# Patient Record
Sex: Female | Born: 1965 | Race: White | Hispanic: No | Marital: Married | Smoking: Former smoker
Health system: Southern US, Community
[De-identification: ages and names within clinical notes are randomized; demographics above are authoritative.]

## PROBLEM LIST (undated history)

## (undated) DIAGNOSIS — Z79899 Other long term (current) drug therapy: Secondary | ICD-10-CM

## (undated) DIAGNOSIS — K219 Gastro-esophageal reflux disease without esophagitis: Secondary | ICD-10-CM

## (undated) DIAGNOSIS — I119 Hypertensive heart disease without heart failure: Secondary | ICD-10-CM

## (undated) DIAGNOSIS — G8929 Other chronic pain: Secondary | ICD-10-CM

## (undated) DIAGNOSIS — R079 Chest pain, unspecified: Secondary | ICD-10-CM

## (undated) DIAGNOSIS — J309 Allergic rhinitis, unspecified: Secondary | ICD-10-CM

## (undated) DIAGNOSIS — I480 Paroxysmal atrial fibrillation: Secondary | ICD-10-CM

## (undated) DIAGNOSIS — IMO0001 Reserved for inherently not codable concepts without codable children: Secondary | ICD-10-CM

## (undated) DIAGNOSIS — I519 Heart disease, unspecified: Secondary | ICD-10-CM

## (undated) DIAGNOSIS — L600 Ingrowing nail: Secondary | ICD-10-CM

## (undated) DIAGNOSIS — F329 Major depressive disorder, single episode, unspecified: Secondary | ICD-10-CM

## (undated) DIAGNOSIS — M5442 Lumbago with sciatica, left side: Secondary | ICD-10-CM

## (undated) DIAGNOSIS — F419 Anxiety disorder, unspecified: Secondary | ICD-10-CM

## (undated) DIAGNOSIS — M5430 Sciatica, unspecified side: Secondary | ICD-10-CM

## (undated) DIAGNOSIS — Z975 Presence of (intrauterine) contraceptive device: Secondary | ICD-10-CM

## (undated) DIAGNOSIS — I10 Essential (primary) hypertension: Secondary | ICD-10-CM

## (undated) DIAGNOSIS — E669 Obesity, unspecified: Secondary | ICD-10-CM

## (undated) DIAGNOSIS — S83206A Unspecified tear of unspecified meniscus, current injury, right knee, initial encounter: Secondary | ICD-10-CM

## (undated) DIAGNOSIS — F32A Depression, unspecified: Secondary | ICD-10-CM

## (undated) DIAGNOSIS — M545 Low back pain: Secondary | ICD-10-CM

## (undated) DIAGNOSIS — R7303 Prediabetes: Secondary | ICD-10-CM

## (undated) HISTORY — DX: Gastro-esophageal reflux disease without esophagitis: K21.9

## (undated) HISTORY — DX: Prediabetes: R73.03

## (undated) HISTORY — PX: KNEE SURGERY: SHX244

## (undated) HISTORY — DX: Presence of (intrauterine) contraceptive device: Z97.5

## (undated) HISTORY — DX: Chest pain, unspecified: R07.9

## (undated) HISTORY — DX: Other long term (current) drug therapy: Z79.899

## (undated) HISTORY — DX: Depression, unspecified: F32.A

## (undated) HISTORY — DX: Low back pain: M54.5

## (undated) HISTORY — DX: Obesity, unspecified: E66.9

## (undated) HISTORY — DX: Lumbago with sciatica, left side: M54.42

## (undated) HISTORY — DX: Other chronic pain: G89.29

## (undated) HISTORY — DX: Essential (primary) hypertension: I10

## (undated) HISTORY — DX: Morbid (severe) obesity due to excess calories: E66.01

## (undated) HISTORY — DX: Anxiety disorder, unspecified: F41.9

## (undated) HISTORY — DX: Sciatica, unspecified side: M54.30

## (undated) HISTORY — DX: Major depressive disorder, single episode, unspecified: F32.9

## (undated) HISTORY — DX: Hypertensive heart disease without heart failure: I11.9

## (undated) HISTORY — DX: Reserved for inherently not codable concepts without codable children: IMO0001

## (undated) HISTORY — DX: Unspecified tear of unspecified meniscus, current injury, right knee, initial encounter: S83.206A

## (undated) HISTORY — DX: Paroxysmal atrial fibrillation: I48.0

## (undated) HISTORY — PX: APPENDECTOMY: SHX54

## (undated) HISTORY — DX: Ingrowing nail: L60.0

## (undated) HISTORY — DX: Heart disease, unspecified: I51.9

## (undated) HISTORY — DX: Allergic rhinitis, unspecified: J30.9

---

## 1987-11-29 HISTORY — PX: TUBAL LIGATION: SHX77

## 1999-06-26 ENCOUNTER — Emergency Department (HOSPITAL_COMMUNITY): Admission: EM | Admit: 1999-06-26 | Discharge: 1999-06-26 | Payer: Self-pay | Admitting: Emergency Medicine

## 2001-09-29 ENCOUNTER — Encounter: Payer: Self-pay | Admitting: Emergency Medicine

## 2001-09-29 ENCOUNTER — Emergency Department (HOSPITAL_COMMUNITY): Admission: EM | Admit: 2001-09-29 | Discharge: 2001-09-29 | Payer: Self-pay | Admitting: Emergency Medicine

## 2001-10-18 ENCOUNTER — Emergency Department (HOSPITAL_COMMUNITY): Admission: EM | Admit: 2001-10-18 | Discharge: 2001-10-18 | Payer: Self-pay | Admitting: Emergency Medicine

## 2002-01-18 ENCOUNTER — Ambulatory Visit (HOSPITAL_BASED_OUTPATIENT_CLINIC_OR_DEPARTMENT_OTHER): Admission: RE | Admit: 2002-01-18 | Discharge: 2002-01-18 | Payer: Self-pay | Admitting: Orthopedic Surgery

## 2002-02-05 ENCOUNTER — Encounter: Admission: RE | Admit: 2002-02-05 | Discharge: 2002-03-18 | Payer: Self-pay | Admitting: Orthopedic Surgery

## 2002-06-16 ENCOUNTER — Emergency Department (HOSPITAL_COMMUNITY): Admission: EM | Admit: 2002-06-16 | Discharge: 2002-06-16 | Payer: Self-pay | Admitting: Emergency Medicine

## 2002-06-16 ENCOUNTER — Encounter: Payer: Self-pay | Admitting: Emergency Medicine

## 2002-11-01 ENCOUNTER — Ambulatory Visit (HOSPITAL_BASED_OUTPATIENT_CLINIC_OR_DEPARTMENT_OTHER): Admission: RE | Admit: 2002-11-01 | Discharge: 2002-11-02 | Payer: Self-pay | Admitting: Orthopedic Surgery

## 2004-03-10 ENCOUNTER — Ambulatory Visit (HOSPITAL_BASED_OUTPATIENT_CLINIC_OR_DEPARTMENT_OTHER): Admission: RE | Admit: 2004-03-10 | Discharge: 2004-03-10 | Payer: Self-pay | Admitting: Orthopedic Surgery

## 2004-04-07 ENCOUNTER — Encounter: Admission: RE | Admit: 2004-04-07 | Discharge: 2004-04-07 | Payer: Self-pay | Admitting: Family Medicine

## 2004-06-04 ENCOUNTER — Encounter: Admission: RE | Admit: 2004-06-04 | Discharge: 2004-06-04 | Payer: Self-pay | Admitting: Sports Medicine

## 2004-07-28 ENCOUNTER — Encounter: Admission: RE | Admit: 2004-07-28 | Discharge: 2004-07-28 | Payer: Self-pay | Admitting: Family Medicine

## 2004-08-23 ENCOUNTER — Ambulatory Visit: Payer: Self-pay | Admitting: Sports Medicine

## 2004-10-01 ENCOUNTER — Ambulatory Visit: Payer: Self-pay | Admitting: Sports Medicine

## 2004-10-13 ENCOUNTER — Emergency Department (HOSPITAL_COMMUNITY): Admission: EM | Admit: 2004-10-13 | Discharge: 2004-10-13 | Payer: Self-pay | Admitting: Family Medicine

## 2004-11-28 DIAGNOSIS — Z975 Presence of (intrauterine) contraceptive device: Secondary | ICD-10-CM

## 2004-11-28 HISTORY — DX: Presence of (intrauterine) contraceptive device: Z97.5

## 2004-12-24 ENCOUNTER — Ambulatory Visit: Payer: Self-pay | Admitting: Family Medicine

## 2005-03-16 ENCOUNTER — Emergency Department (HOSPITAL_COMMUNITY): Admission: EM | Admit: 2005-03-16 | Discharge: 2005-03-16 | Payer: Self-pay | Admitting: Emergency Medicine

## 2005-05-08 ENCOUNTER — Emergency Department (HOSPITAL_COMMUNITY): Admission: AD | Admit: 2005-05-08 | Discharge: 2005-05-08 | Payer: Self-pay | Admitting: Emergency Medicine

## 2005-07-11 ENCOUNTER — Emergency Department (HOSPITAL_COMMUNITY): Admission: EM | Admit: 2005-07-11 | Discharge: 2005-07-12 | Payer: Self-pay | Admitting: Emergency Medicine

## 2005-08-09 ENCOUNTER — Ambulatory Visit: Payer: Self-pay | Admitting: Family Medicine

## 2005-08-24 ENCOUNTER — Ambulatory Visit: Payer: Self-pay | Admitting: Family Medicine

## 2005-09-07 ENCOUNTER — Emergency Department (HOSPITAL_COMMUNITY): Admission: EM | Admit: 2005-09-07 | Discharge: 2005-09-07 | Payer: Self-pay | Admitting: Family Medicine

## 2005-09-08 ENCOUNTER — Ambulatory Visit: Payer: Self-pay | Admitting: Sports Medicine

## 2005-11-28 DIAGNOSIS — IMO0001 Reserved for inherently not codable concepts without codable children: Secondary | ICD-10-CM

## 2005-11-28 HISTORY — DX: Reserved for inherently not codable concepts without codable children: IMO0001

## 2006-02-24 ENCOUNTER — Ambulatory Visit: Payer: Self-pay | Admitting: Family Medicine

## 2006-03-13 ENCOUNTER — Ambulatory Visit: Payer: Self-pay | Admitting: Sports Medicine

## 2006-03-28 ENCOUNTER — Encounter (INDEPENDENT_AMBULATORY_CARE_PROVIDER_SITE_OTHER): Payer: Self-pay | Admitting: *Deleted

## 2006-04-14 ENCOUNTER — Ambulatory Visit: Payer: Self-pay | Admitting: Sports Medicine

## 2006-05-23 ENCOUNTER — Ambulatory Visit: Payer: Self-pay | Admitting: Family Medicine

## 2006-09-19 ENCOUNTER — Emergency Department (HOSPITAL_COMMUNITY): Admission: EM | Admit: 2006-09-19 | Discharge: 2006-09-19 | Payer: Self-pay | Admitting: Emergency Medicine

## 2006-10-10 ENCOUNTER — Ambulatory Visit: Payer: Self-pay | Admitting: Family Medicine

## 2006-10-25 ENCOUNTER — Ambulatory Visit: Payer: Self-pay | Admitting: Family Medicine

## 2007-01-25 DIAGNOSIS — M545 Low back pain, unspecified: Secondary | ICD-10-CM

## 2007-01-25 DIAGNOSIS — R079 Chest pain, unspecified: Secondary | ICD-10-CM | POA: Insufficient documentation

## 2007-01-25 DIAGNOSIS — R51 Headache: Secondary | ICD-10-CM

## 2007-01-25 DIAGNOSIS — M5430 Sciatica, unspecified side: Secondary | ICD-10-CM

## 2007-01-25 DIAGNOSIS — F329 Major depressive disorder, single episode, unspecified: Secondary | ICD-10-CM

## 2007-01-25 DIAGNOSIS — E669 Obesity, unspecified: Secondary | ICD-10-CM

## 2007-01-25 DIAGNOSIS — R519 Headache, unspecified: Secondary | ICD-10-CM | POA: Insufficient documentation

## 2007-01-25 DIAGNOSIS — G8929 Other chronic pain: Secondary | ICD-10-CM

## 2007-01-25 DIAGNOSIS — I10 Essential (primary) hypertension: Secondary | ICD-10-CM

## 2007-01-25 HISTORY — DX: Essential (primary) hypertension: I10

## 2007-01-25 HISTORY — DX: Sciatica, unspecified side: M54.30

## 2007-01-25 HISTORY — DX: Major depressive disorder, single episode, unspecified: F32.9

## 2007-01-25 HISTORY — DX: Low back pain, unspecified: M54.50

## 2007-01-25 HISTORY — DX: Obesity, unspecified: E66.9

## 2007-01-26 ENCOUNTER — Encounter (INDEPENDENT_AMBULATORY_CARE_PROVIDER_SITE_OTHER): Payer: Self-pay | Admitting: *Deleted

## 2007-06-11 ENCOUNTER — Encounter (INDEPENDENT_AMBULATORY_CARE_PROVIDER_SITE_OTHER): Payer: Self-pay | Admitting: Family Medicine

## 2007-06-11 ENCOUNTER — Emergency Department (HOSPITAL_COMMUNITY): Admission: EM | Admit: 2007-06-11 | Discharge: 2007-06-11 | Payer: Self-pay | Admitting: Emergency Medicine

## 2007-06-17 ENCOUNTER — Emergency Department (HOSPITAL_COMMUNITY): Admission: EM | Admit: 2007-06-17 | Discharge: 2007-06-17 | Payer: Self-pay | Admitting: Emergency Medicine

## 2007-08-09 ENCOUNTER — Telehealth (INDEPENDENT_AMBULATORY_CARE_PROVIDER_SITE_OTHER): Payer: Self-pay | Admitting: *Deleted

## 2007-08-14 ENCOUNTER — Emergency Department (HOSPITAL_COMMUNITY): Admission: EM | Admit: 2007-08-14 | Discharge: 2007-08-14 | Payer: Self-pay | Admitting: Emergency Medicine

## 2007-08-14 LAB — CONVERTED CEMR LAB
AST: 19 units/L
BUN: 6 mg/dL
Bilirubin Urine: NEGATIVE
Calcium: 9.7 mg/dL
Chloride: 103 meq/L
Creatinine, Ser: 0.73 mg/dL
HCT: 41.5 %
Hemoglobin, Urine: NEGATIVE
Hemoglobin: 14.2 g/dL
Ketones, ur: NEGATIVE mg/dL
MCV: 82.6 fL
RDW: 14.2 %
Total Bilirubin: 0.5 mg/dL
Urine Glucose: NEGATIVE mg/dL
Urobilinogen, UA: 0.2
pH: 6

## 2007-08-20 ENCOUNTER — Ambulatory Visit: Payer: Self-pay | Admitting: Nurse Practitioner

## 2007-08-20 DIAGNOSIS — K8020 Calculus of gallbladder without cholecystitis without obstruction: Secondary | ICD-10-CM | POA: Insufficient documentation

## 2007-08-20 DIAGNOSIS — J309 Allergic rhinitis, unspecified: Secondary | ICD-10-CM

## 2007-08-20 DIAGNOSIS — M79609 Pain in unspecified limb: Secondary | ICD-10-CM | POA: Insufficient documentation

## 2007-08-20 DIAGNOSIS — E876 Hypokalemia: Secondary | ICD-10-CM

## 2007-08-20 HISTORY — DX: Allergic rhinitis, unspecified: J30.9

## 2007-08-20 LAB — CONVERTED CEMR LAB
ANA Titer 1: 1:40 {titer} — ABNORMAL HIGH
Anti Nuclear Antibody(ANA): POSITIVE — AB
Sed Rate: 17 mm/hr (ref 0–22)

## 2007-08-21 ENCOUNTER — Encounter (INDEPENDENT_AMBULATORY_CARE_PROVIDER_SITE_OTHER): Payer: Self-pay | Admitting: Nurse Practitioner

## 2007-08-27 ENCOUNTER — Ambulatory Visit: Payer: Self-pay | Admitting: *Deleted

## 2007-08-27 ENCOUNTER — Ambulatory Visit (HOSPITAL_COMMUNITY): Admission: RE | Admit: 2007-08-27 | Discharge: 2007-08-27 | Payer: Self-pay | Admitting: Nurse Practitioner

## 2007-09-13 ENCOUNTER — Telehealth (INDEPENDENT_AMBULATORY_CARE_PROVIDER_SITE_OTHER): Payer: Self-pay | Admitting: *Deleted

## 2007-09-14 ENCOUNTER — Emergency Department (HOSPITAL_COMMUNITY): Admission: EM | Admit: 2007-09-14 | Discharge: 2007-09-14 | Payer: Self-pay | Admitting: Emergency Medicine

## 2007-11-14 ENCOUNTER — Ambulatory Visit: Payer: Self-pay | Admitting: Nurse Practitioner

## 2007-11-15 LAB — CONVERTED CEMR LAB
Basophils Absolute: 0 10*3/uL (ref 0.0–0.1)
Basophils Relative: 0 % (ref 0–1)
MCHC: 32.5 g/dL (ref 30.0–36.0)
Neutro Abs: 8.1 10*3/uL — ABNORMAL HIGH (ref 1.7–7.7)
Neutrophils Relative %: 75 % (ref 43–77)
RBC: 4.87 M/uL (ref 3.87–5.11)
RDW: 14.5 % (ref 11.5–15.5)

## 2008-01-09 ENCOUNTER — Ambulatory Visit: Payer: Self-pay | Admitting: Family Medicine

## 2008-01-21 ENCOUNTER — Ambulatory Visit: Payer: Self-pay | Admitting: Nurse Practitioner

## 2008-11-28 HISTORY — PX: CHOLECYSTECTOMY: SHX55

## 2008-12-21 ENCOUNTER — Emergency Department (HOSPITAL_COMMUNITY): Admission: EM | Admit: 2008-12-21 | Discharge: 2008-12-22 | Payer: Self-pay | Admitting: Emergency Medicine

## 2010-12-19 ENCOUNTER — Encounter: Payer: Self-pay | Admitting: Obstetrics

## 2011-03-14 LAB — COMPREHENSIVE METABOLIC PANEL
AST: 15 U/L (ref 0–37)
Albumin: 3.5 g/dL (ref 3.5–5.2)
Alkaline Phosphatase: 66 U/L (ref 39–117)
BUN: 11 mg/dL (ref 6–23)
GFR calc Af Amer: >60 mL/min (ref >60)
Potassium: 3.6 mEq/L (ref 3.5–5.1)
Total Protein: 6.5 g/dL (ref 6.0–8.3)

## 2011-03-14 LAB — URINALYSIS, ROUTINE W REFLEX MICROSCOPIC
Glucose, UA: NEGATIVE mg/dL
Hgb urine dipstick: NEGATIVE
Specific Gravity, Urine: 1.014 (ref 1.005–1.030)

## 2011-03-14 LAB — CBC
HCT: 37.3 % (ref 36.0–46.0)
Platelets: 252 10*3/uL (ref 150–400)
RDW: 14.1 % (ref 11.5–15.5)

## 2011-03-14 LAB — DIFFERENTIAL
Basophils Relative: 1 % (ref 0–1)
Lymphocytes Relative: 24 % (ref 12–46)
Monocytes Absolute: 0.6 10*3/uL (ref 0.1–1.0)
Monocytes Relative: 8 % (ref 3–12)
Neutro Abs: 5 10*3/uL (ref 1.7–7.7)

## 2011-03-14 LAB — URINE MICROSCOPIC-ADD ON

## 2011-03-14 LAB — POCT PREGNANCY, URINE: Preg Test, Ur: NEGATIVE

## 2011-03-24 ENCOUNTER — Ambulatory Visit: Payer: Self-pay | Admitting: Family Medicine

## 2011-04-15 ENCOUNTER — Encounter: Payer: Self-pay | Admitting: Family Medicine

## 2011-04-15 ENCOUNTER — Ambulatory Visit (INDEPENDENT_AMBULATORY_CARE_PROVIDER_SITE_OTHER): Payer: Self-pay | Admitting: Family Medicine

## 2011-04-15 DIAGNOSIS — R7303 Prediabetes: Secondary | ICD-10-CM

## 2011-04-15 DIAGNOSIS — F3289 Other specified depressive episodes: Secondary | ICD-10-CM

## 2011-04-15 DIAGNOSIS — M5430 Sciatica, unspecified side: Secondary | ICD-10-CM

## 2011-04-15 DIAGNOSIS — F329 Major depressive disorder, single episode, unspecified: Secondary | ICD-10-CM

## 2011-04-15 DIAGNOSIS — E669 Obesity, unspecified: Secondary | ICD-10-CM

## 2011-04-15 DIAGNOSIS — R7309 Other abnormal glucose: Secondary | ICD-10-CM

## 2011-04-15 DIAGNOSIS — I10 Essential (primary) hypertension: Secondary | ICD-10-CM

## 2011-04-15 MED ORDER — FLUOXETINE HCL 40 MG PO CAPS
40.0000 mg | ORAL_CAPSULE | Freq: Every day | ORAL | Status: DC
Start: 2011-04-15 — End: 2011-11-11

## 2011-04-15 MED ORDER — LISINOPRIL-HYDROCHLOROTHIAZIDE 10-12.5 MG PO TABS: 1.0000 | ORAL_TABLET | Freq: Every day | ORAL | Status: DC

## 2011-04-15 MED ORDER — GABAPENTIN 300 MG PO CAPS: 300.0000 mg | ORAL_CAPSULE | Freq: Four times a day (QID) | ORAL | Status: DC

## 2011-04-15 NOTE — Op Note (Signed)
[place]  Patient:    Nancy Wu, Nancy Wu Visit Number: [id number] MRN: [id number]          Service Type: DSU Location: [facility] Attending Physician:  Milly Jakob Dictated by:   Harvie Junior, M.D. Proc. Date: 01/18/02 Admit Date:  01/18/2002                             Operative Report  PREOPERATIVE DIAGNOSIS:  Patellofemoral pain bilateral.  POSTOPERATIVE DIAGNOSIS:  Patellofemoral pain bilateral.  PRINCIPAL PROCEDURE:  Right knee arthroscopy with debridement of chondral injury of the patella and a lateral retinacular release.  SURGEON:  Harvie Junior, M.D.  ASSISTANT:  Currie Paris. Thedore Mins.  ANESTHESIA:  General.  BRIEF HISTORY:  She is a 45 year old female with a long history of having bilateral knee pain.  She is having a lot of catching and locking in the knee and sensed the knee wanted to give out on her.  Because of continued complaints of pain, she ultimately underwent conservative care, including injection therapy, anti-inflammatory medication, activity modification, and none of this worked.  Ultimately, because of continued complaints of pain, the patient was taken to the operating room for knee arthroscopy.  PROCEDURE:  The patient was taken to the operating room and after adequate anesthesia was obtained with general anesthetic, the patient placed supine on the operating table. The right knee was then prepped and draped in usual sterile fashion.  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  This was debrided with a suction/shaver back to a smooth and stable rim.  The patellar tracking was noted at that time to be fairly lateral with a lateral tilt.  At this point, attention was turned to the medial weightbearing compartment and there was noted to be no significant chondral injury and no meniscal injury.  The anterior cruciate  was identified and noted to be normal. Attention was turned laterally, but again, there was no significant articular cartilage injury.  At this time, attention was turned back up to the patella, where the camera was moved to the opposite portal and the lateral retinacular release was performed under direct vision with an arthroscopic Bovie.  The patella came out of its lateral tilted position at that point and had excellent midline tracking.  This was evaluated again from the opposite portal.  At this point, the knee was copiously irrigated and suctioned dry. The portals were closed with sterile compressive dressing and the patient was left positioned for a procedure to be performed on the opposite knee.  The estimated blood loss for the procedure was none. Dictated by:   Harvie Junior, M.D. Attending Physician:  Milly Jakob DD:  01/18/02 TD:  01/19/02 Job: [id number]

## 2011-04-15 NOTE — Progress Notes (Signed)
  Subjective:    Patient ID: Nancy Wu, female    DOB: 1966/07/12, 45 y.o.   MRN: [id number]  HPI  1. HTN: Previously on water pill plus something else. No CP, SOB, LE edema.  2. Borderline DM: Due for A1c.  3. Morbid Obesity: Needs labs.  4. LBP with sciatica: Chronic. No trauma. No incontinence. Some increased weakness. Possibly dragging foot. No OP.  Review of Systems SEE HPI.    Objective:   Physical Exam  Constitutional: She appears well-developed and well-nourished. No distress.  Neck: No thyromegaly present.  Cardiovascular: Normal rate, regular rhythm, normal heart sounds and intact distal pulses.   Pulmonary/Chest: Effort normal and breath sounds normal.  Musculoskeletal:       Lumbar spinal process TTP. + SLR left. Strength symmetric. Normal reflexes.      Assessment & Plan:

## 2011-04-15 NOTE — Op Note (Signed)
NAME:  Nancy Wu, Nancy Wu                      ACCOUNT NO.:  [id number]   MEDICAL RECORD NO.:  [id number]                   PATIENT TYPE:  AMB   LOCATION:  DSC                                  FACILITY:  [facility]   PHYSICIAN:  Harvie Junior, M.D.                DATE OF BIRTH:  11/28/1966   DATE OF PROCEDURE:  11/01/2002  DATE OF DISCHARGE:                                 OPERATIVE REPORT   PREOPERATIVE DIAGNOSIS:  Anterior knee pain with patellar subluxation  laterally.   POSTOPERATIVE DIAGNOSIS:  Anterior knee pain with patellar subluxation  laterally.   OPERATION PERFORMED:  1. Arthroscopic debridement of posterior articular cartilage and lateral     retinacular release.  2. Open Fulkerson slide with anteriorization and medialization of the tibial     tubercle.   SURGEON:  Harvie Junior, M.D.   ASSISTANT:  Marshia Ly, P. A.   ANESTHESIA:  General.   BRIEF HISTORY:  The patient is a 45 year old female with a long history of  having significant bilateral knee pain.  She ultimately has been having some  right-sided subluxation.  She ultimately underwent a right-sided knee  arthroscopy with debridement of chondromalacia and mild retinacular release.  She improved, but continued to have episodes of subluxation and patellar  pain.  Because of this the patient was ultimately evaluated and felt to need  stabilization and centralization of the kneecap; and, after discussion with  the patient we ultimately elected to undergo an arthroscopic lateral release  with Fulkerson slide.  The patient was brought to the operating room for  this procedure.   DESCRIPTION OF OPERATION:  The patient was brought to the operating room and  after adequate anesthesia was obtained with general endotracheal anesthesia  the patient was placed supine on the operating table.  The right leg was  then prepped and draped in the usual sterile fashion. Following Esmarch  exsanguination of the extremity  the above sterile tourniquet was inflated to  350 mmHg.  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  The medial and lateral joints  looked good without evidence of arthritic change.  The chondromalacia on the  posterior surface of the patella was then debrided.   Attention was then turned to the lateral retinaculum where a repeat lateral  retinacular release was performed.  Attention at this time was turned  distally where an incision was made from the patellae tendon insertion  distally.  The patellar tendon attachment to the tibia was then identified.  A saw was then used to make an angled cut from the tibial tubercle  posterolaterally creating an angled type cut.  This was continued distally.  Following this then tibia was anteriorized and medialized approximately 1  cm.  Once this was completed the  tibial portion was held in place with a  Steinmann pin and then locked in place with two 4.5 mm cancellous screws;  one with a washer and one with no washer.  Excellent fixation was achieved.  We put the knee through a range of motion.  No tendency towards motion  subluxation or abnormality was evaluated at that point.   The lateral musculature was then reattached to the lateral aspect of the  bone and the wound was then copiously irrigated and suctioned dry.  Fluoro  was used to make sure that the screws were bicortical in nature and the  arthroscope was reinserted into the knee to evaluate the patella and clearly  now centralization of the patella was achieved throughout the entire range  of motion.  At this point the wound was closed in layers and skin with  staples.  A sterile compressive  dressing was applied and the patient was  taken to the recovery room where she was noted to be in satisfactory  condition.   ESTIMATED BLOOD LOSS:  Estimated  blood for the procedure was none.                                                 Harvie Junior, M.D.    Ranae Plumber  D:  11/01/2002  T:  11/01/2002  Job:  [id number]

## 2011-04-15 NOTE — Op Note (Signed)
NAME:  Nancy Wu, Nancy Wu                      ACCOUNT NO.:  [id number]   MEDICAL RECORD NO.:  [id number]                   PATIENT TYPE:  AMB   LOCATION:  DSC                                  FACILITY:  [facility]   PHYSICIAN:  Harvie Junior, M.D.                DATE OF BIRTH:  09-07-1966   DATE OF PROCEDURE:  03/10/2004  DATE OF DISCHARGE:                                 OPERATIVE REPORT   PREOPERATIVE DIAGNOSIS:  Patellofemoral pain with pain over retained  hardware, status post Fulkerson slide.   POSTOPERATIVE DIAGNOSIS:  Patellofemoral pain with pain over retained  hardware, status post Fulkerson slide.   PRINCIPAL PROCEDURE:  1. Knee arthroscopy with debridement of chondromalacia of patella.  2. Removal of retained deep hardware.   SURGEON:  Harvie Junior, M.D.   ASSISTANT:  Marshia Ly, P.A.   ANESTHESIA:  General.   BRIEF HISTORY:  Nancy Wu is a 45 year old female with a long history of  having had patellofemoral pain.  She ultimately underwent a lateral  retinacular release, followed by a Fulkerson slide.  She did well with this.  She began having some concern or feeling that she was having medial  subluxation of the patella and she was ultimately evaluated up in [place].  They felt that observation was the most appropriate course of action.  We  followed her with observation.  Her medial subluxation seemed to subside but  she began having continued complaints of pain over the hardware.  At that  point, it was felt that the patient needed removal of her hardware and felt  that we wanted to evaluate her patella situation operatively, and she was  brought to the operating room for these procedures.   PROCEDURE:  The patient was taken to the operating room and after adequate  anesthesia was obtained with general anesthetic, the patient placed on the  operating table.  The right leg was prepped and draped in the usual sterile  fashion.  Following this, routine  arthroscopic examination of the knee  revealed tracking of the patella was perfectly midline. There was no  tendency of medial or lateral subluxation.  We could push the  patella into  a laterally subluxed position, could not push it out laterally.  Medially,  you could push the patella such that it would come to the point.  The  patella would get as far as the medial femoral condyle, could not go beyond  that, and ultimately would track back to the midline.  It was felt at this  point that there was no significant reason to pursue anything else.  There  was a little bit of chondromalacia of the patella.  This was debrided and  attention was turned down to the medial and lateral compartments, which were  essentially normal.  At this point, the tourniquet was inflated to 350 mmHg  after the arthroscopic instruments had been removed, and the  knee was  drained.  A stab incision was made proximally.  Subcutaneous tissue was  dissected down to the level of the proximal screw.  It was backed out with a  screw driver and the washer was removed with it.  Attention was turned  distally where the distal screw was removed through a stab incision.  At  this point, the wounds were copiously irrigated and suctioned dry.  The knee wounds were closed with a bandage, distally closed with nylon.  A  sterile compressive dressing was applied and the patient taken to the  recovery room and noted to be in satisfactory condition.  Estimated blood  loss for the procedure was negligible.                                               Harvie Junior, M.D.    Ranae Plumber  D:  03/10/2004  T:  03/11/2004  Job:  [id number]

## 2011-04-15 NOTE — Op Note (Signed)
[place]  Patient:    Nancy Wu, Nancy Wu Visit Number: [id number] MRN: [id number]          Service Type: DSU Location: [facility] Attending Physician:  Milly Jakob Dictated by:   Harvie Junior, M.D. Proc. Date: 01/18/02 Admit Date:  01/18/2002   CC:         Dr. Cain Saupe, [place],   Operative Report  PREOPERATIVE DIAGNOSIS:  Patellofemoral knee pain with locking and catching with failure of conservative care.  POSTOPERATIVE DIAGNOSIS:  Patellofemoral knee pain with locking and catching with failure of conservative care.  PRINCIPAL PROCEDURES: 1. Debridement of chondromalacia patella. 2. Lateral retinacular release.  SURGEON:  Harvie Junior, M.D.  ASSISTANT:  Currie Paris. Thedore Mins.  ANESTHESIA:  General.  BRIEF HISTORY:  A 45 year old female with a long history of having significant bilateral knee pain.  The pain was felt to be sort of anterior knee pain in nature.  Because of failure of conservative care, the patient was taken to the operating room for operative knee arthroscopy and she underwent a right knee arthroscopy and at this point was taken for a left knee arthroscopy.  PROCEDURE:  The patient was taken to the operating room and after adequate anesthesia was obtained with general anesthetic, the patient placed supine on the operating table.  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  The chondromalacia on the undersurface of the patella was debrided with a suction/shaver back to a smooth and stable rim.  Attention was turned down to the ______ weightbearing compartment, which was pristine.  The anterior cruciate was within normal limits and the lateral compartment  was within normal limits.  At this point, attention was turned back up to the tight lateral lateral retinaculum, where a lateral retinacular release was performed under direct vision with an arthroscopic Bovie.  The patellofemoral joint then had a much more midline tracking without patellar tilt.  At this point, the knee was copiously irrigated and suctioned dry.  The arthroscopic portals were closed with a sterile compressive dressing and the patient then had a bandage applied and she was taken to recovery, where she was noted to be in satisfactory condition.  Estimated blood loss for the procedure was none. Dictated by:   Harvie Junior, M.D. Attending Physician:  Milly Jakob DD:  01/18/02 TD:  01/19/02 Job: [id number]

## 2011-04-18 ENCOUNTER — Encounter: Payer: Self-pay | Admitting: Family Medicine

## 2011-04-18 ENCOUNTER — Other Ambulatory Visit: Payer: Self-pay

## 2011-04-18 DIAGNOSIS — R7303 Prediabetes: Secondary | ICD-10-CM | POA: Insufficient documentation

## 2011-04-18 NOTE — Assessment & Plan Note (Signed)
Hx of Pre-DM. Check A1c.

## 2011-04-18 NOTE — Assessment & Plan Note (Signed)
Restart medication. Check labs, including CMP, FLP, TSH.

## 2011-04-18 NOTE — Assessment & Plan Note (Addendum)
Rx Neurontin. Since pain with palpation of spine, will order XRAY.

## 2011-04-18 NOTE — Assessment & Plan Note (Signed)
Continue medication.

## 2011-04-18 NOTE — Assessment & Plan Note (Signed)
Discussed methods to start healthier living.

## 2011-04-22 ENCOUNTER — Encounter: Payer: Self-pay | Admitting: Family Medicine

## 2011-04-22 ENCOUNTER — Other Ambulatory Visit (INDEPENDENT_AMBULATORY_CARE_PROVIDER_SITE_OTHER): Payer: Medicaid Other

## 2011-04-22 DIAGNOSIS — R7303 Prediabetes: Secondary | ICD-10-CM

## 2011-04-22 DIAGNOSIS — I10 Essential (primary) hypertension: Secondary | ICD-10-CM

## 2011-04-22 DIAGNOSIS — R7309 Other abnormal glucose: Secondary | ICD-10-CM

## 2011-04-22 LAB — TSH: TSH: 2.459 u[IU]/mL (ref 0.350–4.500)

## 2011-04-22 LAB — POCT GLYCOSYLATED HEMOGLOBIN (HGB A1C): Hemoglobin A1C: 5.9

## 2011-04-22 LAB — COMPREHENSIVE METABOLIC PANEL
ALT: 22 U/L (ref 0–35)
AST: 19 U/L (ref 0–37)
Albumin: 4 g/dL (ref 3.5–5.2)
Calcium: 9 mg/dL (ref 8.4–10.5)
Chloride: 100 mEq/L (ref 96–112)
Potassium: 4.3 mEq/L (ref 3.5–5.3)

## 2011-04-22 LAB — LIPID PANEL: Total CHOL/HDL Ratio: 3.4 Ratio

## 2011-04-22 NOTE — Progress Notes (Signed)
CMP,TSH ,FLP AND HGB A1C DONE TODAY Nancy Wu

## 2011-04-26 ENCOUNTER — Ambulatory Visit
Admission: RE | Admit: 2011-04-26 | Discharge: 2011-04-26 | Disposition: A | Discharge status: Home / Self Care | Payer: Medicaid Other | Source: Ambulatory Visit | Attending: Family Medicine | Admitting: Family Medicine

## 2011-04-26 DIAGNOSIS — M5430 Sciatica, unspecified side: Secondary | ICD-10-CM

## 2011-04-27 ENCOUNTER — Encounter: Payer: Self-pay | Admitting: Family Medicine

## 2011-04-29 ENCOUNTER — Encounter: Payer: Self-pay | Admitting: Family Medicine

## 2011-04-29 ENCOUNTER — Ambulatory Visit (INDEPENDENT_AMBULATORY_CARE_PROVIDER_SITE_OTHER): Payer: Medicaid Other | Admitting: Family Medicine

## 2011-04-29 DIAGNOSIS — Z975 Presence of (intrauterine) contraceptive device: Secondary | ICD-10-CM

## 2011-04-29 DIAGNOSIS — M545 Low back pain, unspecified: Secondary | ICD-10-CM

## 2011-04-29 DIAGNOSIS — J309 Allergic rhinitis, unspecified: Secondary | ICD-10-CM | POA: Insufficient documentation

## 2011-04-29 DIAGNOSIS — I10 Essential (primary) hypertension: Secondary | ICD-10-CM

## 2011-04-29 DIAGNOSIS — B351 Tinea unguium: Secondary | ICD-10-CM

## 2011-04-29 MED ORDER — TERBINAFINE HCL 250 MG PO TABS: 250.0000 mg | ORAL_TABLET | Freq: Every day | ORAL | Status: AC

## 2011-04-29 MED ORDER — CETIRIZINE HCL 10 MG PO TABS: 10.0000 mg | ORAL_TABLET | Freq: Every day | ORAL | Status: DC

## 2011-04-29 MED ORDER — FLUTICASONE PROPIONATE 50 MCG/ACT NA SUSP: 1.0000 | Freq: Every day | NASAL | Status: DC

## 2011-04-29 NOTE — Assessment & Plan Note (Signed)
With Sciatica. Improved on Neurontin.

## 2011-04-29 NOTE — Progress Notes (Signed)
  Subjective:    Patient ID: Nancy Wu, female    DOB: 1966/05/01, 45 y.o.   MRN: [id number]  Hypertension    1. HTN: Rx Lisinopril/HCTZ. No CP, SOB, LE edema. BP elevated today, but endorses more normal numbers at home: "127/76."  4. LBP with Sciatica: Chronic. No trauma. No incontinence. Some increased weakness. No OP. Recent XRAY WNL. Neurontin helping to improve pain.  3. Seasonal Allergies: Updating medication list.  4. IUD: Needs removal. Hx of syncope with last procedure.  Review of Systems SEE HPI.    Objective:   Physical Exam  Constitutional: She appears well-developed and well-nourished. No distress.  Neck: No thyromegaly present.  Cardiovascular: Normal rate, regular rhythm, normal heart sounds and intact distal pulses.   Pulmonary/Chest: Effort normal and breath sounds normal.  Musculoskeletal:       Lumbar spinal process TTP. + SLR left. Strength symmetric. Normal reflexes.  Extremities: Onychomycosis to bilateral toenails.    Assessment & Plan:

## 2011-04-29 NOTE — Patient Instructions (Signed)
It was nice to see you today!  I will send in your prescriptions.  Please make an appointment for an IUD removal and insertion in the [facility] with Dr. Jennette Kettle.

## 2011-04-29 NOTE — Assessment & Plan Note (Signed)
Asked patient to make appointment in [facility] for removal and insertion since patient had complications previously. Will discuss with Dr. Jennette Kettle and try to make the appointment.

## 2011-04-29 NOTE — Assessment & Plan Note (Signed)
Updated medication list

## 2011-04-29 NOTE — Assessment & Plan Note (Signed)
Continue current treatment. 

## 2011-05-05 ENCOUNTER — Ambulatory Visit: Payer: Medicaid Other | Admitting: Family Medicine

## 2011-05-12 ENCOUNTER — Telehealth: Payer: Self-pay | Admitting: Family Medicine

## 2011-05-12 NOTE — Telephone Encounter (Signed)
Called pt. Pt said, that Dr.Wallace told her to call back, if tylenol and ibuprofen does not work for her pain. Pt still takes the neurontin.  Will fwd. To Dr.Wallace for review. Lorenda Hatchet, Renato Battles

## 2011-05-12 NOTE — Telephone Encounter (Signed)
Still having back pain.  Need something else for pain.

## 2011-05-16 ENCOUNTER — Telehealth: Payer: Self-pay | Admitting: *Deleted

## 2011-05-16 ENCOUNTER — Other Ambulatory Visit: Payer: Self-pay | Admitting: Family Medicine

## 2011-05-16 DIAGNOSIS — M545 Low back pain: Secondary | ICD-10-CM

## 2011-05-16 MED ORDER — HYDROCODONE-ACETAMINOPHEN 5-500 MG PO TABS: 1.0000 | ORAL_TABLET | Freq: Three times a day (TID) | ORAL | Status: DC | PRN

## 2011-05-16 NOTE — Telephone Encounter (Signed)
Called pt to pick up Vicodin Rx .Nancy Wu

## 2011-05-16 NOTE — Assessment & Plan Note (Signed)
Due to severity of pain and possible allergy/risk of SS on Ultram, will Rx Vicodin, low dose, prn SEVERE PAIN.

## 2011-05-26 ENCOUNTER — Ambulatory Visit (INDEPENDENT_AMBULATORY_CARE_PROVIDER_SITE_OTHER): Payer: Medicaid Other | Admitting: Family Medicine

## 2011-05-26 ENCOUNTER — Other Ambulatory Visit (HOSPITAL_COMMUNITY)
Admission: RE | Admit: 2011-05-26 | Discharge: 2011-05-26 | Disposition: A | Discharge status: Home / Self Care | Payer: Medicaid Other | Source: Ambulatory Visit | Attending: Family Medicine | Admitting: Family Medicine

## 2011-05-26 ENCOUNTER — Encounter: Payer: Self-pay | Admitting: Family Medicine

## 2011-05-26 VITALS — BP 117/80 | HR 72 | Temp 98.2°F | Ht 63.0 in | Wt 331.0 lb

## 2011-05-26 DIAGNOSIS — Z124 Encounter for screening for malignant neoplasm of cervix: Secondary | ICD-10-CM

## 2011-05-26 DIAGNOSIS — Z30430 Encounter for insertion of intrauterine contraceptive device: Secondary | ICD-10-CM

## 2011-05-26 DIAGNOSIS — Z01419 Encounter for gynecological examination (general) (routine) without abnormal findings: Secondary | ICD-10-CM | POA: Insufficient documentation

## 2011-05-26 DIAGNOSIS — R109 Unspecified abdominal pain: Secondary | ICD-10-CM

## 2011-05-26 DIAGNOSIS — Z309 Encounter for contraceptive management, unspecified: Secondary | ICD-10-CM

## 2011-05-26 MED ORDER — KETOROLAC TROMETHAMINE 60 MG/2ML IM SOLN
60.0000 mg | Freq: Once | INTRAMUSCULAR | Status: AC
  Administered 2011-05-26: 60 mg via INTRAMUSCULAR

## 2011-05-27 MED ORDER — LEVONORGESTREL 20 MCG/24HR IU IUD
INTRAUTERINE_SYSTEM | Freq: Once | INTRAUTERINE | Status: AC
  Administered 2011-05-27: 13:00:00 via INTRAUTERINE

## 2011-05-31 ENCOUNTER — Encounter: Payer: Self-pay | Admitting: Family Medicine

## 2011-05-31 NOTE — Progress Notes (Signed)
  Subjective:    Patient ID: Nancy Wu, female    DOB: 07-Oct-1966, 45 y.o.   MRN: [id number]  HPI  Patient here for removal of expired IUD and placement of a new one. She has had no problems with the IUD and has no questions about it.  Review of Systems Denies recent fever, no pelvic or abdominal pain.    Objective:   Physical Exam     GENERAL: Well-developed female no acute distress PROCEDURE NOTE: IUD removal Patient given informed consent for IUD removal. She is aware this will stop the birth control method provided by the IUD immediately. Informed consent given and signed copy in the chart. Patient placed in the ;ithotomy position and the cervix brought into view using speculum. The IUD strings were identified coming from the cervical os. These strings were grasped with ring forceps, and the IUD withdrawn gently from the uterus. There were no complications and no blood loss. Patient tolerated the procedure well.  IUD INSERTION: Patient given informed consent, signed copy in the chart..  Negative pregnancy confirmed.  Appropriate time out taken.   Sterile instruments and technique was used. Cervix brought into view with use of speculum and then cleansed three times with  betadine swabs.  A tenaculum was placed into the anterior lip of the cervix and a uterine sound was used to measure uterine size.   A Mirena IUD was placed into the endometrial cavity, deployed and secured. It was a difficult placement (narrow endocervical canal) and the patient had some  cramping during the procedure but over al lshe  did well. No bleeding, and cramping subsided in a few minutes.The applicator was removed. The strings were trimmed to 2 centimeters.   There were no complications other than minor discomfit during the procedure and the patient tolerated the procedure well.   She was given handouts for post procedure instructions and information about the IUD including a card with the time of  recommended removal.    Assessment & Plan:  Removal existing IUD and placement of new

## 2011-06-02 ENCOUNTER — Encounter: Payer: Self-pay | Admitting: Family Medicine

## 2011-07-22 ENCOUNTER — Encounter: Payer: Self-pay | Admitting: Family Medicine

## 2011-07-22 ENCOUNTER — Ambulatory Visit (INDEPENDENT_AMBULATORY_CARE_PROVIDER_SITE_OTHER): Payer: Self-pay | Admitting: Family Medicine

## 2011-07-22 DIAGNOSIS — G43909 Migraine, unspecified, not intractable, without status migrainosus: Secondary | ICD-10-CM

## 2011-07-22 DIAGNOSIS — M25569 Pain in unspecified knee: Secondary | ICD-10-CM

## 2011-07-22 DIAGNOSIS — M7700 Medial epicondylitis, unspecified elbow: Secondary | ICD-10-CM

## 2011-07-22 HISTORY — DX: Migraine, unspecified, not intractable, without status migrainosus: G43.909

## 2011-07-22 MED ORDER — SUMATRIPTAN SUCCINATE 100 MG PO TABS: 100.0000 mg | ORAL_TABLET | Freq: Once | ORAL | Status: DC | PRN

## 2011-07-22 NOTE — Assessment & Plan Note (Signed)
Suspect related to abrupt caffeine withdrawal , recommended a small amount of caffeine and slow withdrawal, may use sumatriptan as directed,

## 2011-07-22 NOTE — Assessment & Plan Note (Signed)
Explained condition and gave extensive handout on rehab exercises, gave a script for an elbow band that she may purchase to provide support.

## 2011-07-22 NOTE — Progress Notes (Signed)
  Subjective:    Patient ID: Nancy Wu, female    DOB: [birth year], 45 y.o.   MRN: [id number]  HPI Migraines for over one week, had them in the past.  Took to her bed on four occasions this week.  Last week she quit drinking caffeine sodas.  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  Review of Systems  HENT: Negative for congestion.   Musculoskeletal: Positive for arthralgias.  Neurological: Positive for headaches.       Objective:   Physical Exam  Constitutional: She is oriented to person, place, and time.       Obese  Musculoskeletal: Normal range of motion.       Left sided tenderness over the medial epicondyle   Neurological: She is alert and oriented to person, place, and time. No cranial nerve deficit.          Assessment & Plan:

## 2011-07-22 NOTE — Patient Instructions (Signed)
Epicondylitis, Medial (Golfer's Elbow) with Rehab  Medial epicondylitis involves inflammation and pain around the inner (medial) portion of the elbow. This pain is caused by inflammation of the tendons in the forearm that flex (bring down) the wrist. Medial epicondylitis is also called golfer's elbow, because it is common among golfers. However, it may occur in any individual who flexes the wrist regularly. If medial epicondylitis is left untreated, it may become a chronic problem.   SYMPTOMS  Pain, tenderness, or inflammation over the inner (medial) side of the elbow.  Pain or weakness with gripping activities.  Pain that increases with wrist twisting motions (using a screwdriver, playing golf, bowling).   CAUSES  Medial epicondylitis is caused by inflammation of the tendons that flex the wrist. Causes of injury may include:  Chronic, repetitive stress and strain to the tendons that run from the wrist and forearm to the elbow.  Sudden strain on the forearm, including wrist snap when serving balls with racquet sports, or throwing a baseball.   RISK INCREASES WITH   Sports or occupations that require repetitive and/or strenuous forearm and wrist movements (pitching a baseball, golfing, carpentry).  Poor wrist and forearm strength and flexibility.   Failure to warm up properly before activity.  Resuming activity before healing, rehabilitation, and conditioning are complete.   PREVENTIVE MEASURES   Warm up and stretch properly before activity.  Maintain physical fitness: l Strength, flexibility, and endurance. l Cardiovascular fitness.  Wear and use properly fitted equipment.  Learn and use proper technique and have a coach correct improper technique.  Wear a tennis elbow (counterforce) brace.   PROGNOSIS The course of this condition depends on the degree of the injury. If treated properly, acute cases (symptoms lasting less than 4 weeks) are often resolved in 2 to 6 weeks.  Chronic (longer lasting cases) often resolve in 3 to 6 months, but may require physical therapy.   POSSIBLE COMPLICATIONS   Frequently recurring symptoms, resulting in a chronic problem. Properly treating the problem the first time decreases frequency of recurrence.  Chronic inflammation, scarring, and partial tendon tear, requiring surgery.  Delayed healing or resolution of symptoms.   GENERAL TREATMENT CONSIDERATIONS  Treatment first involves the use of ice and medicine, to reduce pain and inflammation. Strengthening and stretching exercises may reduce discomfort, if performed regularly. These exercises may be performed at home, if the condition is an acute injury. Chronic cases may require a referral to a physical therapist for evaluation and treatment. Your caregiver may advise a corticosteroid injection to help reduce inflammation. Rarely, surgery is needed.   MEDICATION:   If pain medicine is needed, nonsteroidal anti-inflammatory medicines (aspirin and ibuprofen), or other minor pain relievers (acetaminophen), are often advised.   Do not take pain medicine for 7 days before surgery.   Prescription pain relievers may be given, if your caregiver thinks they are needed. Use only as directed and only as much as you need.  Corticosteroid injections may be recommended. These injections should be reserved only for the most severe cases, because they can only be given a certain number of times.   HEAT AND COLD:   Cold treatment (icing) should be applied for 10 to 15 minutes every 2 to 3 hours for inflammation and pain, and immediately after activity that aggravates your symptoms. Use ice packs or an ice massage.  Heat treatment may be used before performing stretching and strengthening activities prescribed by your caregiver, physical therapist, or athletic trainer. Use a heat pack  or a warm water soak.     SEEK MEDICAL CARE IF:  Symptoms get worse or do not improve in 2 weeks, despite  treatment.     EXERCISES   RANGE OF MOTION AND STRETCHING EXERCISES - Epicondylitis, Medial (Golfer's Elbow) These exercises may help you when beginning to rehabilitate your injury. Your symptoms may go away with or without further involvement from your physician, physical therapist or athletic trainer. While completing these exercises, remember:   Restoring tissue flexibility helps normal motion to return to the joints. This allows healthier, less painful movement and activity.  An effective stretch should be held for at least 30 seconds.  A stretch should never be painful. You should only feel a gentle lengthening or release in the stretched tissue.    RANGE OF MOTION - Wrist Flexion, Active-Assisted  Extend your __________ elbow with your fingers pointing down.*   Gently pull the back of your hand towards you, until you feel a gentle stretch on the top of your forearm.   Hold this position for __________ seconds.  Repeat __________ times. Complete this exercise __________ times per day.    *If directed by your physician, physical therapist or athletic trainer, complete this stretch with your elbow bent, rather than extended.     RANGE OF MOTION - Wrist Extension, Active-Assisted   Extend your __________ elbow and turn your palm upwards.*  Gently pull your palm and fingertips back, so your wrist extends and your fingers point more toward the ground.    You should feel a gentle stretch on the inside of your forearm.  Hold this position for __________ seconds.  Repeat __________ times. Complete this exercise __________ times per day.   *If directed by your physician, physical therapist or athletic trainer, complete this stretch with your elbow bent, rather than extended.     STRETCH - Wrist Extension   Place your __________ fingertips on a tabletop leaving your elbow slightly bent.  Your fingers should point backwards.  Gently press your fingers and palm down onto the  table, by straightening your elbow.  You should feel a stretch on the inside of your forearm.   Hold this position for __________ seconds.  Repeat __________ times. Complete this stretch __________ times per day.      STRENGTHENING EXERCISES - Epicondylitis, Medial (Golfer's Elbow) These exercises may help you when beginning to rehabilitate your injury. They may resolve your symptoms with or without further involvement from your physician, physical therapist or athletic trainer. While completing these exercises, remember:   Muscles can gain both the endurance and the strength needed for everyday activities through controlled exercises.  Complete these exercises as instructed by your physician, physical therapist or athletic trainer. Increase the resistance and repetitions only as guided.  You may experience muscle soreness or fatigue, but the pain or discomfort you are trying to eliminate should never worsen during these exercises. If this pain does get worse, stop and make sure you are following the directions exactly. If the pain is still present after adjustments, discontinue the exercise until you can discuss the trouble with your caregiver.     STRENGTH - Wrist Flexors  Sit with your _____left_____ forearm palm-up, and fully supported on a table or countertop. Your elbow should be resting below the height of your shoulder. Allow your wrist to extend over the edge of the surface.   Loosely holding a ___2_______ pound weight, or a piece of rubber exercise band or tubing, slowly curl your  hand up toward your forearm.    Hold this position for ____5-10______ seconds. Slowly lower the wrist back to the starting position in a controlled manner. Repeat ____10-20______ times. Complete this exercise _______2___ times per day.      STRENGTH - Wrist Extensors  Sit with your ___left_______ forearm palm-down and fully supported. Your elbow should be resting below the height of your shoulder.  Allow your wrist to extend over the edge of the surface.  Loosely holding a ____2______ pound weight, or a piece of rubber exercise band or tubing, slowly curl your hand up toward your forearm.    Hold this position for ____5-10______ seconds. Slowly lower the wrist back to the starting position in a controlled manner. Repeat ___10-20_______ times. Complete this exercise ___2_______ times per day.      STRENGTH - Ulnar Deviators  Stand with a ____2________________ pound weight in your ___left_______ hand, or sit while holding a rubber exercise band or tubing, with your healthy arm supported on a table or countertop.  Move your wrist so that your pinkie travels toward your forearm and your thumb moves away from your forearm.  Hold this position for _____5-10_____ seconds and then slowly lower the wrist back to the starting position. Repeat ___10-20_______ times. Complete this exercise _____2_____ times per day    STRENGTH - Grip   Grasp a tennis ball, a dense sponge, or a large, rolled sock in your hand.  Squeeze as hard as you can, without increasing any pain.  Hold this position for ______5-10____ seconds. Release your grip slowly. Repeat _____10-20_____ times. Complete this exercise ______2____ times per day.     STRENGTH - Forearm Supinators   Sit with your ___left_______ forearm supported on a table, keeping your elbow below shoulder height.  Rest your hand over the edge, palm down.   Gently grip a ___small_______ oz. hammer or a soup ladle.   Without moving your elbow, slowly turn your palm and hand upward to a "thumbs-up" position.  Hold this position for ___5-10_______ seconds. Slowly return to the starting position.  Repeat __10-20________ times. Complete this exercise ___2_______ times per day.      STRENGTH left- Forearm Pronators   Sit with your __left________ forearm supported on a table, keeping your elbow below shoulder height.  Rest your hand over the edge,  palm up.  Gently grip a __small________ oz. hammer or a soup ladle.   Without moving your elbow, slowly turn your palm and hand upward to a "thumbs-up" position.  Hold this position for ___5-10_______ seconds. Slowly return to the starting position.  Repeat _____10-20_____ times. Complete this exercise ______2____ times per day.    Document Released: 11/14/2005  Document Re-Released: 09/10/2009 [facility] Patient Information 2011 [place], [place].  For migraines:  Migraine Headache A migraine headache is an intense, throbbing pain on one or both sides of your head. The exact cause of a migraine headache is not always known. A migraine may be caused when nerves in the brain become irritated and release chemicals that cause swelling (inflammation) within blood vessels, causing pain. Many migraine sufferers have a family history of migraines. Before you get a migraine you may or may not get an aura. An aura is a group of symptoms that can predict the beginning of a migraine. An aura may include:  Visual changes such as:   Flashing lights.   Seeing bright spots or zig-zag lines.   Tunnel vision.   Feelings of numbness.   Trouble talking.   Muscle  weakness.  SYMPTOMS OF A MIGRAINE  A migraine headache has one or more of the following symptoms:  Pain on one or both sides of your head.   Pain that is pulsating or throbbing in nature.   Pain that is severe enough to prevent daily activities.   Pain that is aggravated by any daily physical activity.   Nausea (feeling sick to your stomach), vomiting or both.   Pain with exposure to bright lights, loud noises or activity.   General sensitivity to bright lights or loud noises.  MIGRAINE TRIGGERS A migraine headache can be triggered by many things. Examples of triggers include:   Alcohol.   Smoking.   Stress.   It may be related to menses (female menstruation).   Aged cheeses.   Foods or drinks that contain nitrates,  glutamate, aspartame or tyramine.   Lack of sleep.   Chocolate.   Caffeine.   Hunger.   Medications such as nitroglycerine (used to treat chest pain), birth control pills, estrogen and some blood pressure medications.  DIAGNOSIS  A migraine headache is often diagnosed based on:   Your symptoms.   Physical examination.   A CT scan of your head may be ordered to see if your headaches are caused from other medical conditions.  HOME CARE INSTRUCTIONS  Medications can help prevent migraines if they are recurrent or should they become recurrent. Your caregiver can help you with a medication or treatment program that will be helpful to you.   If you get a migraine, it may be helpful to lie down in a dark, quiet room.   It may be helpful to keep a headache diary. This may help you find a trend as to what may be triggering your headaches.  SEEK IMMEDIATE MEDICAL CARE IF:   You do not get relief from the medications given to you or you have a recurrence of pain.   You have confusion, personality changes or seizures.   You have headaches that wake you from sleep.   You have an increased frequency in your headaches.   You have a stiff neck.   You have a loss of vision.   You have muscle weakness.   You start losing your balance or have trouble walking.   You feel faint or pass out.  MAKE SURE YOU:   Understand these instructions.   Will watch your condition.   Will get help right away if you are not doing well or get worse.  Document Released: 11/14/2005 Document Re-Released: 09/11/2009 [facility] Patient Information 2011 [place], [place].

## 2011-09-07 LAB — POCT INFECTIOUS MONO SCREEN: Mono Screen: NEGATIVE

## 2011-09-08 LAB — URINALYSIS, ROUTINE W REFLEX MICROSCOPIC
Bilirubin Urine: NEGATIVE
Ketones, ur: NEGATIVE
Nitrite: NEGATIVE
pH: 6

## 2011-09-08 LAB — CBC
Hemoglobin: 14.2
MCHC: 34.3
RBC: 5.02
RDW: 14.2 — ABNORMAL HIGH

## 2011-09-08 LAB — PREGNANCY, URINE: Preg Test, Ur: NEGATIVE

## 2011-09-08 LAB — URINE MICROSCOPIC-ADD ON

## 2011-09-08 LAB — COMPREHENSIVE METABOLIC PANEL
ALT: 24
AST: 19
Alkaline Phosphatase: 70
Calcium: 9.7
GFR calc Af Amer: >60
Potassium: 3.3 — ABNORMAL LOW
Sodium: 140
Total Protein: 7.4

## 2011-09-12 LAB — CBC
HCT: 41.3
Hemoglobin: 14.1
MCHC: 34.2
MCV: 81.8
Platelets: 302
RBC: 5.04
RDW: 14.4 — ABNORMAL HIGH
WBC: 7.7

## 2011-09-12 LAB — DIFFERENTIAL
Basophils Relative: 1
Lymphocytes Relative: 15
Lymphs Abs: 1.1
Monocytes Relative: 5
Neutro Abs: 6.1
Neutrophils Relative %: 79 — ABNORMAL HIGH

## 2011-09-12 LAB — COMPREHENSIVE METABOLIC PANEL
Albumin: 3.5
Alkaline Phosphatase: 53
BUN: 5 — ABNORMAL LOW
Calcium: 9.2
Creatinine, Ser: 0.74
Glucose, Bld: 105 — ABNORMAL HIGH
Total Protein: 6.9

## 2011-09-13 LAB — DIFFERENTIAL
Basophils Absolute: 0
Eosinophils Relative: 0
Lymphocytes Relative: 15
Lymphs Abs: 1.5
Monocytes Absolute: 0.5
Monocytes Relative: 5
Neutro Abs: 7.9 — ABNORMAL HIGH

## 2011-09-13 LAB — COMPREHENSIVE METABOLIC PANEL
ALT: 17
Alkaline Phosphatase: 67
BUN: 3 — ABNORMAL LOW
CO2: 26
Chloride: 103
GFR calc non Af Amer: >60
Glucose, Bld: 100 — ABNORMAL HIGH
Potassium: 3.6
Sodium: 138
Total Bilirubin: 0.3

## 2011-09-13 LAB — URINALYSIS, ROUTINE W REFLEX MICROSCOPIC
Ketones, ur: NEGATIVE
Nitrite: NEGATIVE
pH: 5.5

## 2011-09-13 LAB — CBC
MCHC: 34.7
MCV: 82.3
WBC: 10

## 2011-09-13 LAB — PREGNANCY, URINE: Preg Test, Ur: NEGATIVE

## 2011-09-13 LAB — ETHANOL: Alcohol, Ethyl (B): <5

## 2011-10-12 ENCOUNTER — Ambulatory Visit: Payer: Self-pay | Admitting: Family Medicine

## 2011-11-11 ENCOUNTER — Emergency Department (HOSPITAL_COMMUNITY): Payer: Self-pay

## 2011-11-11 ENCOUNTER — Emergency Department (HOSPITAL_COMMUNITY)
Admission: EM | Admit: 2011-11-11 | Discharge: 2011-11-11 | Disposition: A | Discharge status: Home / Self Care | Payer: Self-pay | Attending: Emergency Medicine | Admitting: Emergency Medicine

## 2011-11-11 ENCOUNTER — Other Ambulatory Visit (HOSPITAL_COMMUNITY): Payer: Self-pay

## 2011-11-11 DIAGNOSIS — M25569 Pain in unspecified knee: Secondary | ICD-10-CM | POA: Insufficient documentation

## 2011-11-11 DIAGNOSIS — W172XXA Fall into hole, initial encounter: Secondary | ICD-10-CM | POA: Insufficient documentation

## 2011-11-11 DIAGNOSIS — IMO0002 Reserved for concepts with insufficient information to code with codable children: Secondary | ICD-10-CM | POA: Insufficient documentation

## 2011-11-11 DIAGNOSIS — I10 Essential (primary) hypertension: Secondary | ICD-10-CM | POA: Insufficient documentation

## 2011-11-11 DIAGNOSIS — S8391XA Sprain of unspecified site of right knee, initial encounter: Secondary | ICD-10-CM

## 2011-11-11 DIAGNOSIS — Y92009 Unspecified place in unspecified non-institutional (private) residence as the place of occurrence of the external cause: Secondary | ICD-10-CM | POA: Insufficient documentation

## 2011-11-11 MED ORDER — HYDROCODONE-ACETAMINOPHEN 5-325 MG PO TABS
1.0000 | ORAL_TABLET | Freq: Once | ORAL | Status: AC
  Administered 2011-11-11: 1 via ORAL
  Filled 2011-11-11: qty 1

## 2011-11-11 MED ORDER — HYDROCODONE-ACETAMINOPHEN 5-500 MG PO TABS: 1.0000 | ORAL_TABLET | Freq: Four times a day (QID) | ORAL | Status: DC | PRN

## 2011-11-11 NOTE — ED Notes (Signed)
Pt assisted to BR.

## 2011-11-11 NOTE — ED Provider Notes (Signed)
History     CSN: [id number] Arrival date & time: 11/11/2011  5:17 PM   First MD Initiated Contact with Patient 11/11/11 1836      Chief Complaint  Patient presents with  . Knee Injury    (Consider location/radiation/quality/duration/timing/severity/associated sxs/prior treatment) HPI  Patient presents to emergency department complaining of right knee injury. Patient states she is walking in her front yard at 3:30 this afternoon and stepped into a hole causing her right knee to twist and causing her to fall to ground landing on her right knee. Patient states pain was immediate onset and she limped inside and applied ice to her knee. Patient is complaining of swelling around knee. Patient states she has history of having an "knee  Cap adjustment" by Dr. Luiz Blare in the past. Patient states she is able to apply some weight but with pain in knee. Patient denies additional injury. Patient denies hitting head or loss of consciousness. She denies pain in her hips or bilateral ankles. Patient has not taken anything for pain prior to arrival. Pain is aggravated by walking and improved with sitting still.  Past Medical History  Diagnosis Date  . Hypertension   . Morbid obesity   . Borderline diabetes   . Depression   . Anxiety   . Lumbago with sciatica of left side   . IUD 2007    Past Surgical History  Procedure Date  . Cesarean section 1989  . Cholecystectomy 2010  . Knee surgery     Bilateral.  . Tubal ligation 1989    Family History  Problem Relation Age of Onset  . Stroke Father     History  Substance Use Topics  . Smoking status: Former Games developer  . Smokeless tobacco: Never Used  . Alcohol Use: Yes     Occasional.    OB History    Grav Para Term Preterm Abortions TAB SAB Ect Mult Living                  Review of Systems  All other systems reviewed and are negative.    Allergies  Codeine  Home Medications   Current Outpatient Rx  Name Route Sig Dispense  Refill  . FLUTICASONE PROPIONATE 50 MCG/ACT NA SUSP Nasal Place 1 spray into the nose daily. 16 g 2  . GABAPENTIN 300 MG PO CAPS Oral Take 1 capsule (300 mg total) by mouth 4 (four) times daily. 120 capsule 2  . IBUPROFEN 200 MG PO TABS Oral Take 200 mg by mouth every 6 (six) hours as needed. For headache or back ache     . LISINOPRIL-HYDROCHLOROTHIAZIDE 10-12.5 MG PO TABS Oral Take 1 tablet by mouth daily. 30 tablet 11  . ONE-A-DAY 50 PLUS PO TABS Oral Take 1 tablet by mouth daily.        BP 143/76  Pulse 72  Temp(Src) 98.1 F (36.7 C) (Oral)  Resp 16  Wt 330 lb (149.687 kg)  SpO2 98%  Physical Exam  Constitutional: She is oriented to person, place, and time. She appears well-developed and well-nourished. No distress.       Morbidly obese  HENT:  Head: Normocephalic and atraumatic.  Eyes: Conjunctivae are normal.  Neck: Normal range of motion. Neck supple.  Cardiovascular: Normal rate and regular rhythm.   Pulmonary/Chest: Effort normal.  Musculoskeletal: She exhibits tenderness. She exhibits no edema.       Right ankle: She exhibits swelling. tenderness.       Tenderness to palpation of  entire right knee with decreased range of motion due to pain. No notable swelling however exam is difficult due to large body habitus. No break in skin or bruising. Negative ballottement. No tenderness to palpation of bilateral ankles or hips. Pelvis is stable. Normal sensation of entire right lower extremity   Neurological: She is alert and oriented to person, place, and time.       Normal sensation of entire foot.   Skin: Skin is warm and dry. No rash noted. She is not diaphoretic. No erythema. No pallor.  Psychiatric: She has a normal mood and affect. Her behavior is normal.    ED Course  Procedures (including critical care time)  By mouth hydrocodone/acetaminophen  Labs Reviewed - No data to display Dg Knee Complete 4 Views Right  11/11/2011  *RADIOLOGY REPORT*  Clinical Data: Larey Seat.   Right knee pain.  RIGHT KNEE - COMPLETE 4+ VIEW  Comparison: None  Findings: The joint spaces are maintained.  Mild degenerative changes.  No acute fracture or osteochondral abnormality.  No definite joint effusion.  IMPRESSION: No acute bony findings.  Mild degenerative changes.  Original Report Authenticated By: P. Loralie Champagne, M.D.     1. Sprain of right knee       MDM  Sprain of right knee without any acute findings on x-ray. Patient is agreeable to following back up with Dr. Luiz Blare for further evaluation of ongoing knee pain. Right lower extremity is neurovascularly intact with patient denying any other injury.        [place], [place] 11/11/11 1925

## 2011-11-11 NOTE — ED Notes (Signed)
Patient transported to X-ray 

## 2011-11-11 NOTE — ED Notes (Signed)
Pt states "was walking in the front yard & stepped in a hole hurting my left knee, it's the knee that had an adjustment on it before"

## 2011-11-12 NOTE — ED Provider Notes (Signed)
Medical screening examination/treatment/procedure(s) were performed by non-physician practitioner and as supervising physician I was immediately available for consultation/collaboration. Devoria Albe, MD, Armando Gang   Ward Givens, MD 11/12/11 [phone]

## 2011-11-16 ENCOUNTER — Ambulatory Visit (INDEPENDENT_AMBULATORY_CARE_PROVIDER_SITE_OTHER): Payer: Self-pay | Admitting: Family Medicine

## 2011-11-16 ENCOUNTER — Encounter: Payer: Self-pay | Admitting: Family Medicine

## 2011-11-16 VITALS — BP 161/88 | HR 69 | Temp 97.8°F | Ht 63.0 in | Wt 341.6 lb

## 2011-11-16 DIAGNOSIS — Z23 Encounter for immunization: Secondary | ICD-10-CM

## 2011-11-16 DIAGNOSIS — M25569 Pain in unspecified knee: Secondary | ICD-10-CM

## 2011-11-16 MED ORDER — HYDROCODONE-ACETAMINOPHEN 5-500 MG PO TABS: ORAL_TABLET | ORAL | Status: DC

## 2011-11-18 NOTE — Progress Notes (Signed)
  Subjective:    Patient ID: Nancy Wu, female    DOB: [birth year], 44 y.o.   MRN: [id number]  HPI  Date of injury November 11, 2011. Was walking in her front yard when she stepped in a depression or hole and fell twisting her right knee. Was seen at the emergency room where they did x-rays. She has been walking on crutches since. She has had continued knee swelling and pain with difficulty bending it and difficulty bearing weight. It has slightly improved since then. She has noted no locking. It feels like it's going to give way but has not yet. She's had no numbness or tingling in the foot.  PERTINENT  PMH / PSH: His previously had an injection into the same knee a few years ago at about the same time she had surgery for a patellar subluxation/dislocation. Review of Systems    denies fever, sweats, chills. Objective:   Physical Exam  Vital signs reviewed. GENERAL: Well developed, well nourished, no acute distress. Obese ;KNEE:  Right. Full extension and full flexion he entered palpation medial joint line. Small effusion. Ligamentously intact to varuand valgus.stress.   she would not allow me to perform Lachman or McMurray secondary to pain. The calf is soft.  SKIN: Well healed midline scar of the right knee. X-rays taken at the emergency department were reviewed and they reveal no bony pathology.   INJECTION: Patient was given informed consent, signed copy in the chart. Appropriate time out was taken. Area prepped and draped in usual sterile fashion. One cc of methylprednisolone 40 mg/ml plus  4 cc of 1% lidocaine without epinephrine was injected into the right knee using a(n) anterior approach. The patient tolerated the procedure well. There were no complications. Post procedure instructions were given.     Assessment & Plan:  Knee pain: Right: I suspect she has a small meniscal injury. She currently has no insurance and does not want to pursue imaging or surgical intervention  should this indeed be an meniscal injury. We discussed options. We will do corticosteroid injection today. She will continue using crutches. I will see her back in 3-4 weeks.

## 2011-12-14 ENCOUNTER — Ambulatory Visit (INDEPENDENT_AMBULATORY_CARE_PROVIDER_SITE_OTHER): Payer: Self-pay | Admitting: Family Medicine

## 2011-12-14 ENCOUNTER — Encounter: Payer: Self-pay | Admitting: Family Medicine

## 2011-12-14 VITALS — BP 155/84 | HR 67 | Temp 98.4°F | Ht 62.0 in | Wt 334.2 lb

## 2011-12-14 DIAGNOSIS — IMO0002 Reserved for concepts with insufficient information to code with codable children: Secondary | ICD-10-CM

## 2011-12-14 DIAGNOSIS — S83206A Unspecified tear of unspecified meniscus, current injury, right knee, initial encounter: Secondary | ICD-10-CM

## 2011-12-15 DIAGNOSIS — S83206A Unspecified tear of unspecified meniscus, current injury, right knee, initial encounter: Secondary | ICD-10-CM

## 2011-12-15 HISTORY — DX: Unspecified tear of unspecified meniscus, current injury, right knee, initial encounter: S83.206A

## 2011-12-15 NOTE — Progress Notes (Signed)
  Subjective:    Patient ID: Nancy Wu, female    DOB: February 24, 1966, 46 y.o.   MRN: [id number]  HPI  F/u right knee injury. Improved but still sore and painful with weight bearing--esp if she has to walk much or if she has to go up stairs. Has not been able to secure insurance yet. The CSI I  Gave her really helped---esp in first week after shot---she would like to have another injection.Is worried that the swelling is still present.  Review of Systems Pertinent review of systems: negative for fever or unusual weight change. Has had some swelling above knee anteriorly when she stands for a long time. No numbness in leg. No calf pain    Objective:   Physical Exam   GENERAL: obese. Walks with a limp---mildly antalgic gait, RIGHT KNEE: exam is limited somewhat by habitus---no effusion. No erythema.Very  Mild soft tissue swelling vs fatty depositioon in medial quad and in shin area. TTP medial joint line. Ligamentously intact to varus and valgus stress. Anterior drawer difficult to perform as she guards quite a bit. Calf is soft. Normal sensation to soft touch in right lower extremity. Popliteal space is benign. No warmth or erythema of knee or ankle.  INJECTION: Patient was given informed consent, signed copy in the chart. Appropriate time out was taken. Area prepped and draped in usual sterile fashion. 1 cc of methylprednisolone 40 mg/ml plus  4 cc of 1% lidocaine without epinephrine was injected into the right knee  using a(n) anterior medial inferior  approach. The patient tolerated the procedure well. There were no complications. Post procedure instructions were given.      Assessment & Plan:  Knee injury---likely meniscal tear. As she has no insurance coverage we have pursued conservative tx (no MRI or prep for surgery). We discussed CSI---I am comfortable with doing a second injection at this time to help decrease inflammation from initial injury. I would not recommend another  injection for 3 months minimum. Hopefully she can attain insurance coverage and we can pursue arthroscopy. She will rtc prn for this knee injury, Recommend f/u PCP for elevated BP, other medical issues. Started her on VMO exercises and heel slides which she has done before.

## 2012-02-23 ENCOUNTER — Ambulatory Visit (INDEPENDENT_AMBULATORY_CARE_PROVIDER_SITE_OTHER): Payer: Self-pay | Admitting: Family Medicine

## 2012-02-23 VITALS — BP 125/78 | HR 68 | Ht 62.0 in | Wt 329.0 lb

## 2012-02-23 DIAGNOSIS — R7303 Prediabetes: Secondary | ICD-10-CM

## 2012-02-23 DIAGNOSIS — S83206A Unspecified tear of unspecified meniscus, current injury, right knee, initial encounter: Secondary | ICD-10-CM

## 2012-02-23 DIAGNOSIS — IMO0002 Reserved for concepts with insufficient information to code with codable children: Secondary | ICD-10-CM

## 2012-02-23 DIAGNOSIS — R7309 Other abnormal glucose: Secondary | ICD-10-CM

## 2012-02-23 DIAGNOSIS — M5430 Sciatica, unspecified side: Secondary | ICD-10-CM

## 2012-02-23 DIAGNOSIS — E669 Obesity, unspecified: Secondary | ICD-10-CM

## 2012-02-23 DIAGNOSIS — I10 Essential (primary) hypertension: Secondary | ICD-10-CM

## 2012-02-23 LAB — POCT GLYCOSYLATED HEMOGLOBIN (HGB A1C): Hemoglobin A1C: 5.9

## 2012-02-23 MED ORDER — GABAPENTIN 300 MG PO CAPS: 300.0000 mg | ORAL_CAPSULE | Freq: Four times a day (QID) | ORAL | Status: DC

## 2012-02-23 MED ORDER — FLUOXETINE HCL 40 MG PO CAPS: 40.0000 mg | ORAL_CAPSULE | Freq: Every day | ORAL | Status: DC

## 2012-02-23 MED ORDER — LISINOPRIL-HYDROCHLOROTHIAZIDE 10-12.5 MG PO TABS: 1.0000 | ORAL_TABLET | Freq: Every day | ORAL | Status: DC

## 2012-02-23 NOTE — Patient Instructions (Signed)
Thank you for coming in today, it was good to see you Your blood pressure looks good today. I would like to see you back in 6 months, we can do your pap at that time If the knee is still bothering you we can think about another injection in 3-4 more months.  Please call with any questions.

## 2012-02-24 LAB — COMPREHENSIVE METABOLIC PANEL
ALT: 17 U/L (ref 0–35)
Alkaline Phosphatase: 49 U/L (ref 39–117)
CO2: 30 mEq/L (ref 19–32)
Creat: 0.71 mg/dL (ref 0.50–1.10)
Sodium: 141 mEq/L (ref 135–145)
Total Bilirubin: 0.2 mg/dL — ABNORMAL LOW (ref 0.3–1.2)
Total Protein: 6.8 g/dL (ref 6.0–8.3)

## 2012-02-24 LAB — CBC
HCT: 42.4 % (ref 36.0–46.0)
MCH: 28 pg (ref 26.0–34.0)
MCV: 88.7 fL (ref 78.0–100.0)
Platelets: 315 10*3/uL (ref 150–400)
RDW: 14.5 % (ref 11.5–15.5)
WBC: 8.8 10*3/uL (ref 4.0–10.5)

## 2012-03-01 NOTE — Assessment & Plan Note (Signed)
BP well controlled, continue current medications.  F/u 6 months.   Labs ordered.

## 2012-03-01 NOTE — Assessment & Plan Note (Signed)
Improved with CSI injections x2.  Managing with NSAID as needed.  Still with some swelling, locking and mild pain.  No insurance for surgery at this time.  Continue conservative management.

## 2012-03-01 NOTE — Progress Notes (Signed)
  Subjective:    Patient ID: Nancy Wu, female    DOB: 1966/08/04, 46 y.o.   MRN: [id number]  HPI 1. HTN:  Here to f/u on HTN.  Taking lisinopril-hctz daily, tolerating well.  Checks blood pressure occasionally at home, typically <130/80-90.  Her and her husband have been making dietary changes and trying to exercise more.  Her exercise is limited however by her knee pain.    2. Knee pain:  Improved, but still painful and swells at times.  Previous notes indicate likely meniscal tear.  Has had CSI x2, with some improvement.  Was told she would likely need surgery again, but she does not have insurance at this time.  Pain is worse with twisting motions and knee does "lock" occasionally.    Review of Systems  Constitutional: Negative for fever, chills and fatigue.  Respiratory: Negative for cough and shortness of breath.   Cardiovascular: Negative for chest pain.       Objective:   Physical Exam  Constitutional: She is oriented to person, place, and time.       Morbidly obese, nad   Neck: Neck supple. No thyromegaly present.  Cardiovascular: Normal rate, regular rhythm and normal heart sounds.   Pulmonary/Chest: Effort normal and breath sounds normal.  Musculoskeletal: She exhibits no edema.  Neurological: She is alert and oriented to person, place, and time.          Assessment & Plan:

## 2012-03-01 NOTE — Assessment & Plan Note (Signed)
Recheck A1c, weight loss and dietary changes, likely to help.

## 2012-03-01 NOTE — Assessment & Plan Note (Signed)
Weight down 12lbs since December 2012.   Encouraged continued lifestyle changes.  Check d-ldl

## 2012-03-13 ENCOUNTER — Encounter: Payer: Self-pay | Admitting: Family Medicine

## 2012-06-14 ENCOUNTER — Ambulatory Visit (INDEPENDENT_AMBULATORY_CARE_PROVIDER_SITE_OTHER): Payer: Self-pay | Admitting: Family Medicine

## 2012-06-14 VITALS — BP 134/78 | HR 77 | Temp 98.4°F | Wt 332.0 lb

## 2012-06-14 DIAGNOSIS — S83206A Unspecified tear of unspecified meniscus, current injury, right knee, initial encounter: Secondary | ICD-10-CM

## 2012-06-14 DIAGNOSIS — IMO0002 Reserved for concepts with insufficient information to code with codable children: Secondary | ICD-10-CM

## 2012-06-14 NOTE — Progress Notes (Signed)
Subjective:     Patient ID: Nancy Wu, female   DOB: 01-28-1966, 46 y.o.   MRN: [id number]  HPI Nancy Wu is a 46 y/o woman with a history of right knee pain and obesity who comes into clinic today for follow up of Nancy knee pain.   The pain started first started in December when she fell. She states that since she was last seen here Nancy right knee has continued to hurt. She reports a constant, dull pain in Nancy knee, with a shooting, sharp pain when she moves it certain ways. She states the pain is worse on the inside of Nancy knee, and is especially tender to the touch in that area.   She reports taking tylenol, tramadol, and ibuprofen at different times to try and control the pain, with little effect. She says the steroid injection she had in January helped with the pain for about 2 weeks, but then wore off. She is interested in having another injection today.   She is also interested in having an MRI, as she was told this might be necessary when the knee pain first started.   She has no other complaints today.   Social Hx: States that she has Nancy final appointment to get Nancy [employer] this afternoon, and should have it this evening.    Review of Systems As per above.     Objective:   Physical Exam General: Well appearing, no acute distress MSK: Left knee: Full ROM, strength 5/5 with flexion and extension, no effusion, no pain with passive motion Right knee: Full ROM, Strength 5/5 with flexion and extension, slight effusion is noted, especially on the medial aspect of Nancy knee, there is pain in Nancy knee with passive flexion and extension, Thessaly test positive   INJECTION: Patient was given informed consent, signed copy in the chart. Appropriate time out was taken. Area prepped and draped in usual sterile fashion. 2 cc of methylprednisolone 40 mg/ml plus  5 cc of 1% lidocaine without epinephrine was injected into the R knee using a(n) anterior approach. The patient tolerated the  procedure well. There were no complications. Post procedure instructions were given.      Assessment/Plan:     1. Knee pain:  46 y/o with a 8 month history of right knee pain presenting for follow up. Still having pain, especially with motion. Thessaly sign positive, indicating that there is still probable meniscal injury.  Will administer steroid injection for Nancy knee in the office today, and will order an MRI. Instructed to follow up when she receives Nancy Wu so the imaging appointment can be scheduled.

## 2012-06-14 NOTE — Assessment & Plan Note (Signed)
1. Knee pain:  46 y/o with a 8 month history of right knee pain presenting for follow up. Still having pain, especially with motion. Thessaly sign positive, indicating that there is still probable meniscal injury.  Will administer steroid injection for her knee in the office today, and will plan to order an MRI. Will ultimately need arthroscopy.  Instructed to follow up when she receives her [facility] Card so the imaging appointment can be scheduled.

## 2012-06-14 NOTE — Patient Instructions (Addendum)
Please let us know if you qualify for the [facility].  Joint Injection Care After Refer to this sheet in the next few days. These instructions provide you with information on caring for yourself after you have had a joint injection. Your caregiver also may give you more specific instructions. Your treatment has been planned according to current medical practices, but problems sometimes occur. Call your caregiver if you have any problems or questions after your procedure. After any type of joint injection, it is not uncommon to experience:  Soreness, swelling, or bruising around the injection site.   Mild numbness, tingling, or weakness around the injection site caused by the numbing medicine used before or with the injection.  It also is possible to experience the following effects associated with the specific agent after injection:  Iodine-based contrast agents:   Allergic reaction (itching, hives, widespread redness, and swelling beyond the injection site).   Corticosteroids (These effects are rare.):   Allergic reaction.   Increased blood sugar levels (If you have diabetes and you notice that your blood sugar levels have increased, notify your caregiver).   Increased blood pressure levels.   Mood swings.   Hyaluronic acid in the use of viscosupplementation.   Temporary heat or redness.   Temporary rash and itching.   Increased fluid accumulation in the injected joint.  These effects all should resolve within a day after your procedure.  HOME CARE INSTRUCTIONS  Limit yourself to light activity the day of your procedure. Avoid lifting heavy objects, bending, stooping, or twisting.   Take prescription or over-the-counter pain medication as directed by your caregiver.   You may apply ice to your injection site to reduce pain and swelling the day of your procedure. Ice may be applied 3 to 4 times:   Put ice in a plastic bag.   Place a towel between your skin and the bag.    Leave the ice on for no longer than 15 to 20 minutes each time.  SEEK IMMEDIATE MEDICAL CARE IF:   Pain and swelling get worse rather than better or extend beyond the injection site.   Numbness does not go away.   Blood or fluid continues to leak from the injection site.   You have chest pain.   You have swelling of your face or tongue.   You have trouble breathing or you become dizzy.   You develop a fever, chills, or severe tenderness at the injection site that last longer than 1 day.  MAKE SURE YOU:  Understand these instructions.   Watch your condition.   Get help right away if you are not doing well or if you get worse.  Document Released: 07/28/2011 Document Revised: 11/03/2011 Document Reviewed: 07/28/2011 [facility] Patient Information 2012 [place], [place].

## 2012-06-21 NOTE — Addendum Note (Signed)
Addended by: Everrett Coombe on: 06/21/2012 11:35 AM   Modules accepted: Level of Service

## 2012-10-17 ENCOUNTER — Ambulatory Visit (INDEPENDENT_AMBULATORY_CARE_PROVIDER_SITE_OTHER): Payer: Self-pay | Admitting: Family Medicine

## 2012-10-17 VITALS — BP 135/78 | HR 88 | Ht 63.0 in | Wt 333.0 lb

## 2012-10-17 DIAGNOSIS — L600 Ingrowing nail: Secondary | ICD-10-CM

## 2012-10-17 NOTE — Patient Instructions (Addendum)
Thank you for coming in today, it was good to see you Keep foot elevated for first 24 hours. Change dressing in 24 hours  Apply daily antibiotic ointment If you notice increased redness, pain or drainage of pus return to see Nancy Wu.  You can shower but avoid soaking for one week Avoid trauma to toe for first 2 weeks  1. Wear loose-fitting shoes 2. Avoid Running, jumping or other potential injury

## 2012-10-21 DIAGNOSIS — L600 Ingrowing nail: Secondary | ICD-10-CM

## 2012-10-21 HISTORY — DX: Ingrowing nail: L60.0

## 2012-10-21 NOTE — Progress Notes (Signed)
  Subjective:    Patient ID: Nancy Wu, female    DOB: 1966/10/27, 46 y.o.   MRN: [id number]  HPI  1. Ingrown toenail:  Here with complaint of ingrown toenail.  Has been present for 1 week, red and draining pus.  Has had in the past with removal of nail.  Would like to have this done again.  She does have a cruise coming up in the next couple of weeks.  Review of Systems Denie fever, chills, pain radiating up leg or pain in toe joint.     Objective:   Physical Exam  Constitutional: No distress.  Musculoskeletal:       R great toe with thickened nail and ingrown into medial side of toe.  There is surrounding erythema and tenderness.    Toenail removal  After consent was obtained. Area was prepped and draped in typical fashion. A digital block was performed using 6cc of 1% lidocaine. A small tourniquet was placed around the great toe to minimize bleeding. After adequate anesthesia was achieved a small periosteal elevator was used to elevate the nail all the way back to the base. A pair of nail splitters was used to initially break the distal nail and scissors was then used to cut away approximately 1/4 of the nail. The free piece of nail was then grasped with hemostats, twisted and removed. The matrix of the nail was treated with phenol and then neutralized with alcohol. Patient tolerated procedure well. Minimal blood loss. Post procedure instructions given.         Assessment & Plan:

## 2012-10-21 NOTE — Assessment & Plan Note (Signed)
Ingrown toenail removed and nail matrix treated with phenol to prevent recurrence.  Given post procedure instruction and red flags that should prompt her return.

## 2012-11-26 ENCOUNTER — Ambulatory Visit: Payer: Self-pay | Admitting: Family Medicine

## 2013-01-12 ENCOUNTER — Other Ambulatory Visit: Payer: Self-pay

## 2013-04-14 ENCOUNTER — Other Ambulatory Visit: Payer: Self-pay | Admitting: Family Medicine

## 2013-04-23 ENCOUNTER — Encounter: Payer: Self-pay | Admitting: Family Medicine

## 2013-04-23 ENCOUNTER — Other Ambulatory Visit: Payer: Self-pay | Admitting: Family Medicine

## 2013-04-23 MED ORDER — FLUOXETINE HCL 40 MG PO CAPS: ORAL_CAPSULE | ORAL | Status: DC

## 2013-04-23 MED ORDER — LISINOPRIL-HYDROCHLOROTHIAZIDE 10-12.5 MG PO TABS: ORAL_TABLET | ORAL | Status: DC

## 2013-05-01 ENCOUNTER — Encounter: Payer: Self-pay | Admitting: Family Medicine

## 2013-05-08 ENCOUNTER — Other Ambulatory Visit: Payer: Self-pay

## 2013-05-08 ENCOUNTER — Ambulatory Visit (HOSPITAL_COMMUNITY)
Admission: RE | Admit: 2013-05-08 | Discharge: 2013-05-08 | Disposition: A | Discharge status: Home / Self Care | Payer: Self-pay | Source: Ambulatory Visit | Attending: Family Medicine | Admitting: Family Medicine

## 2013-05-08 ENCOUNTER — Ambulatory Visit (INDEPENDENT_AMBULATORY_CARE_PROVIDER_SITE_OTHER): Payer: Self-pay | Admitting: Family Medicine

## 2013-05-08 VITALS — BP 142/58 | HR 71 | Ht 62.0 in | Wt 334.0 lb

## 2013-05-08 DIAGNOSIS — R079 Chest pain, unspecified: Secondary | ICD-10-CM | POA: Insufficient documentation

## 2013-05-08 DIAGNOSIS — R9431 Abnormal electrocardiogram [ECG] [EKG]: Secondary | ICD-10-CM | POA: Insufficient documentation

## 2013-05-08 DIAGNOSIS — R7309 Other abnormal glucose: Secondary | ICD-10-CM

## 2013-05-08 DIAGNOSIS — I10 Essential (primary) hypertension: Secondary | ICD-10-CM

## 2013-05-08 DIAGNOSIS — R739 Hyperglycemia, unspecified: Secondary | ICD-10-CM

## 2013-05-08 HISTORY — DX: Chest pain, unspecified: R07.9

## 2013-05-08 LAB — POCT GLYCOSYLATED HEMOGLOBIN (HGB A1C): Hemoglobin A1C: 5.9

## 2013-05-08 LAB — CBC
HCT: 39.9 % (ref 36.0–46.0)
MCHC: 32.6 g/dL (ref 30.0–36.0)
MCV: 83 fL (ref 78.0–100.0)
Platelets: 314 10*3/uL (ref 150–400)
RDW: 14.6 % (ref 11.5–15.5)
WBC: 7.1 10*3/uL (ref 4.0–10.5)

## 2013-05-08 LAB — COMPREHENSIVE METABOLIC PANEL
ALT: 23 U/L (ref 0–35)
AST: 16 U/L (ref 0–37)
CO2: 32 mEq/L (ref 19–32)
Calcium: 9.7 mg/dL (ref 8.4–10.5)
Chloride: 97 mEq/L (ref 96–112)
Creat: 0.79 mg/dL (ref 0.50–1.10)
Potassium: 4.2 mEq/L (ref 3.5–5.3)
Sodium: 138 mEq/L (ref 135–145)
Total Protein: 7.1 g/dL (ref 6.0–8.3)

## 2013-05-08 MED ORDER — NITROGLYCERIN 0.4 MG SL SUBL: 0.4000 mg | SUBLINGUAL_TABLET | SUBLINGUAL | Status: DC | PRN

## 2013-05-08 NOTE — Patient Instructions (Signed)
Thank you for coming in today, it was good to see you Your ekg looks ok, I am placing a referral to cardiology I am also giving you a prescription for nitroglycerin.  If you have another episode use this. If you have chest pain that does not go away or is worsening please return or go to the ED

## 2013-05-08 NOTE — Assessment & Plan Note (Signed)
Chest pain has features of atypical and typical chest pain.  She does have risk factors of HTN and borderline diabetes.  Check A1c and LDL for further risk stratification.  EKG with non-specific t wave changes but does not appear to indicate ischemia.  Will refer to cardiology for further assessment, possible stress test.

## 2013-05-08 NOTE — Progress Notes (Signed)
  Subjective:    Patient ID: Nancy Wu, female    DOB: 02/06/1966, 47 y.o.   MRN: [id number]  HPI  1. Chest pain:  Reports intermittent chest.  Has had this over the past 3-4 years but recently has been having episodes more frequently.  She reports that it feels like her heart is "pounding" when she has pain with radiation into her jaw and occasionally her L arm.  She also has associated nausea when this occurs.  The pain typically lasts for ~1 min when it occurs.  Pain is not usually associated or worsened with activity.  She does not have chest pain currently and last episode occurred 3 days ago.    Review of Systems Per HPI    Objective:   Physical Exam  Constitutional:  Morbidly obese, nad   Neck: Neck supple. No thyromegaly present.  Cardiovascular: Normal rate, regular rhythm and normal heart sounds.   Pulmonary/Chest: Breath sounds normal. No respiratory distress. She has no wheezes.  Abdominal: Soft. She exhibits no distension. There is no tenderness.  Musculoskeletal: She exhibits no edema.          Assessment & Plan:

## 2013-07-04 ENCOUNTER — Institutional Professional Consult (permissible substitution): Payer: Self-pay | Admitting: Cardiology

## 2013-08-16 ENCOUNTER — Encounter: Payer: Self-pay | Admitting: Family Medicine

## 2013-08-16 ENCOUNTER — Ambulatory Visit (INDEPENDENT_AMBULATORY_CARE_PROVIDER_SITE_OTHER): Payer: Self-pay | Admitting: Family Medicine

## 2013-08-16 VITALS — BP 122/72 | HR 60 | Temp 98.4°F | Ht 62.0 in | Wt 302.0 lb

## 2013-08-16 DIAGNOSIS — M5432 Sciatica, left side: Secondary | ICD-10-CM

## 2013-08-16 DIAGNOSIS — R079 Chest pain, unspecified: Secondary | ICD-10-CM

## 2013-08-16 DIAGNOSIS — M5430 Sciatica, unspecified side: Secondary | ICD-10-CM

## 2013-08-16 LAB — POCT GLYCOSYLATED HEMOGLOBIN (HGB A1C): Hemoglobin A1C: 5.7

## 2013-08-16 MED ORDER — GABAPENTIN 300 MG PO CAPS: 300.0000 mg | ORAL_CAPSULE | Freq: Four times a day (QID) | ORAL | Status: DC

## 2013-08-16 MED ORDER — LISINOPRIL-HYDROCHLOROTHIAZIDE 10-12.5 MG PO TABS: ORAL_TABLET | ORAL | Status: DC

## 2013-08-16 MED ORDER — FLUOXETINE HCL 40 MG PO CAPS: ORAL_CAPSULE | ORAL | Status: DC

## 2013-08-16 NOTE — Progress Notes (Signed)
  Subjective:    Patient ID: Nancy Wu, female    DOB: 09/24/1966, 47 y.o.   MRN: [id number]  HPI  Pt here after recent admission to [place] for rule out MI. I do not have records but she reports that ACS was ruled out and no new meds were added. They did trial a BB but she states that her HR was in teh 50s and they stopped it  Her episode is described as palpitations accompanied by weakness, confusion, and sweating. She has several year Hx of intermittent chest pain described as mid chest radiating to L arm and L jaw with accompanying nausea. She has nitro but hasnt used it.   She has intermittent palpitations which are not always assoc with the chest pain.  She has discharge paperwork requesting 24 hour event monitoring   Today she denies chest pain, dyspnea, palpitations, fever, chills, and sweats.  She denies smoking Review of Systems Per HPI    Objective:   Physical Exam  Gen: NAD, alert, cooperative with exam HEENT: NCAT CV: RRR, good S1/S2, no murmur Resp: CTABL, no wheezes, non-labored Abd: SNTND, BS present, no guarding or organomegaly Ext: No edema, warm Neuro: Alert and oriented, No gross deficits     Assessment & Plan:

## 2013-08-16 NOTE — Assessment & Plan Note (Signed)
With palpitations after recent rule out at [facility] Atypical and typical features with risk factors of HTN, and A1C of 5.7  today I agree with previous referral, referred to [place] in [place].  Previous LDL 87, would start statin if she was diabetic Recommended daily aspirin.  Will follow closely

## 2013-08-16 NOTE — Assessment & Plan Note (Signed)
Refilled gabapentin

## 2013-08-16 NOTE — Patient Instructions (Addendum)
It was great to see you today!  I will send your refills to your pharmacy Start a baby aspirin  I have sent your referral.   If you have another episodes of racing heart go to the ER. If you have chest pain, esp if you are nauseous, weak ,or lightheaded go to the ED.

## 2013-09-23 ENCOUNTER — Encounter: Payer: Self-pay | Admitting: Family Medicine

## 2013-10-03 ENCOUNTER — Other Ambulatory Visit: Payer: Self-pay

## 2013-10-31 ENCOUNTER — Encounter: Payer: Self-pay | Admitting: Family Medicine

## 2016-09-28 DIAGNOSIS — I4891 Unspecified atrial fibrillation: Secondary | ICD-10-CM

## 2016-09-28 DIAGNOSIS — R079 Chest pain, unspecified: Secondary | ICD-10-CM | POA: Diagnosis not present

## 2016-09-29 ENCOUNTER — Ambulatory Visit: Payer: Self-pay | Admitting: Obstetrics and Gynecology

## 2016-09-29 DIAGNOSIS — I119 Hypertensive heart disease without heart failure: Secondary | ICD-10-CM

## 2016-09-29 DIAGNOSIS — R079 Chest pain, unspecified: Secondary | ICD-10-CM | POA: Diagnosis not present

## 2016-09-29 DIAGNOSIS — K219 Gastro-esophageal reflux disease without esophagitis: Secondary | ICD-10-CM | POA: Insufficient documentation

## 2016-09-29 DIAGNOSIS — I480 Paroxysmal atrial fibrillation: Secondary | ICD-10-CM

## 2016-09-29 DIAGNOSIS — I4891 Unspecified atrial fibrillation: Secondary | ICD-10-CM | POA: Diagnosis not present

## 2016-09-29 HISTORY — PX: CARDIAC CATHETERIZATION: SHX172

## 2016-09-29 HISTORY — DX: Paroxysmal atrial fibrillation: I48.0

## 2016-09-29 HISTORY — DX: Gastro-esophageal reflux disease without esophagitis: K21.9

## 2016-09-29 HISTORY — DX: Hypertensive heart disease without heart failure: I11.9

## 2016-11-02 DIAGNOSIS — Z79899 Other long term (current) drug therapy: Secondary | ICD-10-CM

## 2016-11-02 HISTORY — DX: Other long term (current) drug therapy: Z79.899

## 2017-04-06 ENCOUNTER — Other Ambulatory Visit (HOSPITAL_COMMUNITY)
Admission: RE | Admit: 2017-04-06 | Discharge: 2017-04-06 | Disposition: A | Discharge status: Home / Self Care | Payer: BLUE CROSS/BLUE SHIELD | Source: Ambulatory Visit | Attending: Obstetrics and Gynecology | Admitting: Obstetrics and Gynecology

## 2017-04-06 ENCOUNTER — Ambulatory Visit (INDEPENDENT_AMBULATORY_CARE_PROVIDER_SITE_OTHER): Payer: BLUE CROSS/BLUE SHIELD | Admitting: Obstetrics and Gynecology

## 2017-04-06 ENCOUNTER — Encounter: Payer: Self-pay | Admitting: Obstetrics and Gynecology

## 2017-04-06 VITALS — BP 168/95 | HR 81 | Wt 336.8 lb

## 2017-04-06 DIAGNOSIS — Z01419 Encounter for gynecological examination (general) (routine) without abnormal findings: Secondary | ICD-10-CM | POA: Diagnosis present

## 2017-04-06 DIAGNOSIS — Z30432 Encounter for removal of intrauterine contraceptive device: Secondary | ICD-10-CM

## 2017-04-06 DIAGNOSIS — Z124 Encounter for screening for malignant neoplasm of cervix: Secondary | ICD-10-CM

## 2017-04-06 DIAGNOSIS — Z1151 Encounter for screening for human papillomavirus (HPV): Secondary | ICD-10-CM

## 2017-04-06 MED ORDER — NORETHINDRONE 0.35 MG PO TABS
1.0000 | ORAL_TABLET | Freq: Every day | ORAL | 11 refills | Status: DC
Start: 2017-04-06 — End: 2018-01-11

## 2017-04-06 NOTE — Progress Notes (Signed)
Patient has a Mirena that is past due removal. Patient will need to discuss replacement she uses IUD for cycle control.(This is the second) Patient's last pap was 2 years ago- no history of abnormal.

## 2017-04-06 NOTE — Progress Notes (Signed)
Subjective:     Nancy Wu is a 51 y.o. female who is here for a comprehensive physical exam. The patient reports no problems. She is not sexually active. She has had a Mirena IUD in place for the past 6 years for cycle control. She denies any vaginal bleeding. She denies any abdominal or pelvic pain. She denies any urinary incontinence. She reports hot flushes and night sweats. These symptoms are manageable currently.  Past Medical History:  Diagnosis Date  . Anxiety   . Borderline diabetes   . Depression   . Heart disease    afib  . Hypertension   . IUD 2007  . Lumbago with sciatica of left side   . Morbid obesity (HCC)    Past Surgical History:  Procedure Laterality Date  . CESAREAN SECTION  1989  . CHOLECYSTECTOMY  2010  . KNEE SURGERY     Bilateral.  . TUBAL LIGATION  1989   Family History  Problem Relation Age of Onset  . Stroke Father      Social History   Social History  . Marital status: Married    Spouse name: N/A  . Number of children: N/A  . Years of education: N/A   Occupational History  . Not on file.   Social History Main Topics  . Smoking status: Former Games developermoker  . Smokeless tobacco: Never Used  . Alcohol use Yes     Comment: Occasional.  . Drug use: No  . Sexual activity: Not Currently    Partners: Male    Birth control/ protection: IUD   Other Topics Concern  . Not on file   Social History Narrative  . No narrative on file   Health Maintenance  Topic Date Due  . HIV Screening  12/21/1981  . TETANUS/TDAP  03/28/2013  . PAP SMEAR  05/25/2014  . MAMMOGRAM  12/21/2016  . COLONOSCOPY  12/21/2016  . INFLUENZA VACCINE  06/28/2017       Review of Systems Pertinent items are noted in HPI.   Objective:  Blood pressure (!) 168/95, pulse 81, weight (!) 336 lb 12.8 oz (152.8 kg).     GENERAL: Well-developed, well-nourished female in no acute distress.  HEENT: Normocephalic, atraumatic. Sclerae anicteric.  NECK: Supple. Normal  thyroid.  LUNGS: Clear to auscultation bilaterally.  HEART: Regular rate and rhythm. BREASTS: Symmetric in size. No palpable masses or lymphadenopathy, skin changes, or nipple drainage. ABDOMEN: Soft, nontender, nondistended. No organomegaly. PELVIC: Normal external female genitalia. Vagina is pink and rugated.  Normal discharge. Normal appearing cervix with IUD extending from os. Uterus is normal in size. No adnexal mass or tenderness. EXTREMITIES: No cyanosis, clubbing, or edema, 2+ distal pulses.    Assessment:    Healthy female exam.      Plan:    Pap smear collected IUD removed without difficulty with the use of a Kelly clamp Screening mammogram ordered Patient opted for POP for cycle control and will self discontinue when ready to assess menopause status See After Visit Summary for Counseling Recommendations

## 2017-04-12 LAB — CYTOLOGY - PAP
DIAGNOSIS: NEGATIVE
HPV: NOT DETECTED

## 2017-04-13 ENCOUNTER — Other Ambulatory Visit (HOSPITAL_COMMUNITY): Payer: Self-pay | Admitting: General Surgery

## 2017-04-14 ENCOUNTER — Ambulatory Visit (HOSPITAL_COMMUNITY)
Admission: RE | Admit: 2017-04-14 | Discharge: 2017-04-14 | Disposition: A | Discharge status: Home / Self Care | Payer: BLUE CROSS/BLUE SHIELD | Source: Ambulatory Visit | Attending: General Surgery | Admitting: General Surgery

## 2017-04-14 DIAGNOSIS — Z01810 Encounter for preprocedural cardiovascular examination: Secondary | ICD-10-CM | POA: Insufficient documentation

## 2017-04-14 DIAGNOSIS — Z01818 Encounter for other preprocedural examination: Secondary | ICD-10-CM | POA: Diagnosis not present

## 2017-04-17 ENCOUNTER — Other Ambulatory Visit (HOSPITAL_COMMUNITY): Payer: Self-pay | Admitting: General Surgery

## 2017-05-01 ENCOUNTER — Encounter: Payer: BLUE CROSS/BLUE SHIELD | Attending: General Surgery | Admitting: Registered"

## 2017-05-01 ENCOUNTER — Encounter: Payer: Self-pay | Admitting: Registered"

## 2017-05-01 DIAGNOSIS — Z713 Dietary counseling and surveillance: Secondary | ICD-10-CM | POA: Diagnosis not present

## 2017-05-01 DIAGNOSIS — Z6841 Body Mass Index (BMI) 40.0 and over, adult: Secondary | ICD-10-CM | POA: Insufficient documentation

## 2017-05-01 DIAGNOSIS — E669 Obesity, unspecified: Secondary | ICD-10-CM

## 2017-05-01 NOTE — Progress Notes (Signed)
Pre-Op Assessment Visit:  Pre-Operative RYGB Surgery  Medical Nutrition Therapy:  Appt start time: 3:30  End time:  4:30  Patient was seen on 05/01/2017 for Pre-Operative Nutrition Assessment. [facility] on 05/01/2017.   Pt expectation of surgery: "lose at least 100 lbs, walk without knees killing me, able to walk up stairs"  Pt expectation of Dietitian: help with getting onto nutritious meal program  Start weight at [facility]: 333.1 BMI: 62.94   Pt states she lost 50 lbs last year and has gained 30 lbs since Jan due to the loss of her oldest son. Pt states she likes salty foods and chips.  Per insurance, pt needs 3-4 SWL visits prior to surgery and they will be done with PCP per referral.    24 hr Dietary Recall: First Meal: skips Snack: none Second Meal: ham and cheese sandwich Snack: chips or crackers Third Meal: grilled chicken, mashed potatoes, broccoli or green breans Snack: fruit or chips Beverages: diet soda, water coffee  Encouraged to engage in 150 minutes of moderate physical activity including cardiovascular and weight baring weekly  Handouts given during visit include:  . Pre-Op Goals . Bariatric Surgery Protein Shakes . Vitamin and Mineral Recommendations  During the appointment today the following Pre-Op Goals were reviewed with the patient: . Maintain or lose weight as instructed by your surgeon . Make healthy food choices . Begin to limit portion sizes . Limited concentrated sugars and fried foods . Keep fat/sugar in the single digits per serving on food labels . Practice CHEWING your food  (aim for 30 chews per bite or until applesauce consistency) . Practice not drinking 15 minutes before, during, and 30 minutes after each meal/snack . Avoid all carbonated beverages  . Avoid/limit caffeinated beverages  . Avoid all sugar-sweetened beverages . Consume 3 meals per  day; eat every 3-5 hours . Make a list of non-food related activities . Aim for 64-100 ounces of FLUID daily  . Aim for at least 60-80 grams of PROTEIN daily . Look for a liquid protein source that contain ?15 g protein and ?5 g carbohydrate  (ex: shakes, drinks, shots) . Physical activity is an important part of a healthy lifestyle so keep it moving!  Follow diet recommendations listed below Energy and Macronutrient Recommendations: Calories: 1800 Carbohydrate: 200 Protein: 135 Fat: 50  Demonstrated degree of understanding via:  Teach Back   Teaching Method Utilized:  Visual Auditory Hands on  Barriers to learning/adherence to lifestyle change: none  Patient to call the Nutrition and Diabetes Education Services to enroll in Pre-Op and Post-Op Nutrition Education when surgery date is scheduled.

## 2017-05-15 ENCOUNTER — Ambulatory Visit (HOSPITAL_COMMUNITY): Payer: BLUE CROSS/BLUE SHIELD

## 2017-05-15 ENCOUNTER — Ambulatory Visit (HOSPITAL_COMMUNITY)
Admission: RE | Admit: 2017-05-15 | Discharge: 2017-05-15 | Disposition: A | Discharge status: Home / Self Care | Payer: BLUE CROSS/BLUE SHIELD | Source: Ambulatory Visit | Attending: General Surgery | Admitting: General Surgery

## 2017-06-21 ENCOUNTER — Institutional Professional Consult (permissible substitution): Payer: Self-pay | Admitting: Neurology

## 2017-08-17 ENCOUNTER — Institutional Professional Consult (permissible substitution): Payer: Self-pay | Admitting: Neurology

## 2017-09-25 ENCOUNTER — Encounter: Payer: Self-pay | Admitting: Neurology

## 2017-09-25 DIAGNOSIS — F32A Depression, unspecified: Secondary | ICD-10-CM | POA: Insufficient documentation

## 2017-09-25 DIAGNOSIS — F419 Anxiety disorder, unspecified: Secondary | ICD-10-CM | POA: Insufficient documentation

## 2017-09-25 DIAGNOSIS — I10 Essential (primary) hypertension: Secondary | ICD-10-CM | POA: Insufficient documentation

## 2017-09-25 DIAGNOSIS — F329 Major depressive disorder, single episode, unspecified: Secondary | ICD-10-CM | POA: Insufficient documentation

## 2017-09-25 DIAGNOSIS — I519 Heart disease, unspecified: Secondary | ICD-10-CM | POA: Insufficient documentation

## 2017-09-26 ENCOUNTER — Encounter: Payer: Self-pay | Admitting: Cardiology

## 2017-09-26 DIAGNOSIS — Z7901 Long term (current) use of anticoagulants: Secondary | ICD-10-CM | POA: Insufficient documentation

## 2017-09-26 HISTORY — DX: Long term (current) use of anticoagulants: Z79.01

## 2017-09-26 NOTE — Progress Notes (Signed)
Cardiology Office Note:    Date:  09/27/2017   ID:  Nancy Wu, DOB Mar 25, 1966, MRN [id number]  PCP:  Noni Saupe, MD please do a CMP every 6 months while on Multaq Cardiologist:  Norman Herrlich, MD    Referring MD: No ref. provider found    ASSESSMENT:    1. Paroxysmal atrial fibrillation (HCC)   2. High risk medication use   3. Chronic anticoagulation   4. Essential hypertension    PLAN:    In order of problems listed above:  1. Stable continue her antiarrhythmic drug anticoagulant and her short acting beta-blocker as needed brief episodes of atrial fibrillation heart rate control.  I will access recent labs for liver function and ask her PCP to check every 6 months Multaq 2. Continue Multaq no signs of toxicity 3. Stable continue her anticoagulant 4. Stable continue current antihypertensive medication   Next appointment: July 2019   Medication Adjustments/Labs and Tests Ordered: Current medicines are reviewed at length with the patient today.  Concerns regarding medicines are outlined above.  Orders Placed This Encounter  Procedures  . EKG 12-Lead   Meds ordered this encounter  Medications  . metoprolol tartrate (LOPRESSOR) 25 MG tablet    Sig: Take 1 tablet (25 mg total) by mouth every 6 (six) hours as needed. For AF rate > 120 BPM    Dispense:  30 tablet    Refill:  2    Chief Complaint  Patient presents with  . Follow-up  . Atrial Fibrillation    History of Present Illness:    Nancy Wu is a 51 y.o. female with a hx of Paroxysmal Atrial Fibrillation with a CHADS2score=2 and HTN  last seen in December 2017.  Overall she has done well with her antiarrhythmic drug she has had 3 episodes of atrial fibrillation the last lasting 5 hrs heart rate greater than 120.  We will Arm[place] with a short acting beta-blocker to take for heart rate control continue her antiarrhythmic drug and anticoagulant and reassess in July.  Episodes are more frequent and  bothersome electrophysiology referral would be appropriate. Compliance with diet, lifestyle and medications: Yes Past Medical History:  Diagnosis Date  . ALLERGIC RHINITIS 08/20/2007   Qualifier: Diagnosis of  By: Daphine Deutscher FNP, Zena Amos    . Anxiety   . Borderline diabetes   . Chest pain 05/08/2013   Normal coronary arteriography in November 2017  . Chronic low back pain 01/25/2007   Qualifier: Diagnosis of  By: Bradly Bienenstock    . Depression   . Gastroesophageal reflux disease without esophagitis 09/29/2016  . Heart disease    afib  . High risk medication use 11/02/2016   Overview:  Multaq  . Hypertension   . HYPERTENSION, BENIGN SYSTEMIC 01/25/2007   Qualifier: Diagnosis of  By: Bradly Bienenstock    . Hypertensive heart disease without heart failure 09/29/2016  . Ingrown toenail 10/21/2012  . IUD 2007  . Lumbago with sciatica of left side   . Migraine 07/22/2011  . Morbid obesity (HCC)   . Obesity 01/25/2007   Qualifier: Diagnosis of  By: Bradly Bienenstock    . Paroxysmal atrial fibrillation (HCC) 09/29/2016  . PRESENCE OF IUD 11/28/2004   Qualifier: Diagnosis of  By: Daphine Deutscher FNP, Zena Amos    . Reactive depression 01/25/2007   Qualifier: Diagnosis of  By: Bradly Bienenstock    . Right knee meniscal tear 12/15/2011  . Sciatica 01/25/2007   Qualifier: Diagnosis of  By: Bradly Bienenstock  Past Surgical History:  Procedure Laterality Date  . CARDIAC CATHETERIZATION  09/29/2016  . CESAREAN SECTION  1989  . CHOLECYSTECTOMY  2010  . KNEE SURGERY     Bilateral.  . TUBAL LIGATION  1989    Current Medications: Current Meds  Medication Sig  . apixaban (ELIQUIS) 5 MG TABS tablet Take 5 mg by mouth 2 (two) times daily.   Marland Kitchen b complex vitamins tablet Take 1 tablet by mouth daily.  Marland Kitchen buPROPion (WELLBUTRIN XL) 300 MG 24 hr tablet Take 300 mg by mouth daily.  Marland Kitchen gabapentin (NEURONTIN) 800 MG tablet Take 800 mg by mouth 3 (three) times daily.  Marland Kitchen lisinopril (PRINIVIL,ZESTRIL) 10 MG tablet Take  10 mg by mouth daily.  . MULTAQ 400 MG tablet Take 400 mg by mouth 2 (two) times daily.  . Multiple Vitamins-Minerals (ONE-A-DAY 50 PLUS) TABS Take 1 tablet by mouth daily.    . norethindrone (MICRONOR,CAMILA,ERRIN) 0.35 MG tablet Take 1 tablet (0.35 mg total) by mouth daily.     Allergies:   Codeine   Social History   Social History  . Marital status: Married    Spouse name: N/A  . Number of children: N/A  . Years of education: N/A   Social History Main Topics  . Smoking status: Former Games developer  . Smokeless tobacco: Never Used  . Alcohol use Yes     Comment: Occasional.  . Drug use: No  . Sexual activity: Not Currently    Partners: Male    Birth control/ protection: IUD   Other Topics Concern  . None   Social History Narrative  . None     Family History: The patient's family history includes Alcohol abuse in her brother; Cancer in her other; Depression in her mother and sister; Diabetes in her other; Heart disease in her other; Hypertension in her father, mother, and other; Melanoma in her father; Stroke in her father and other. ROS:   Please see the history of present illness.    All other systems reviewed and are negative.  EKGs/Labs/Other Studies Reviewed:    The following studies were reviewed today:  EKG:  EKG ordered today.  The ekg ordered today demonstrates sinus rhythm normal  Recent Labs: recent labs requested from her PCP No results found for requested labs within last 8760 hours.  Recent Lipid Panel    Component Value Date/Time   CHOL 155 04/22/2011 0900   TRIG 112 04/22/2011 0900   HDL 45 04/22/2011 0900   CHOLHDL 3.4 04/22/2011 0900   VLDL 22 04/22/2011 0900   LDLCALC 88 04/22/2011 0900   LDLDIRECT 80 05/08/2013 1636    Physical Exam:    VS:  BP 130/78 (BP Location: Right Arm, Patient Position: Sitting, Cuff Size: Large)   Pulse 72   Ht 5\' 2"  (1.575 m)   Wt (!) 324 lb (147 kg)   SpO2 96%   BMI 59.26 kg/m     Wt Readings from Last 3  Encounters:  09/27/17 (!) 324 lb (147 kg)  09/27/17 (!) 325 lb (147.4 kg)  05/01/17 (!) 333 lb 1.6 oz (151.1 kg)     GEN:  Well nourished, well developed in no acute distress HEENT: Normal NECK: No JVD; No carotid bruits LYMPHATICS: No lymphadenopathy CARDIAC: RRR, no murmurs, rubs, gallops RESPIRATORY:  Clear to auscultation without rales, wheezing or rhonchi  ABDOMEN: Soft, non-tender, non-distended MUSCULOSKELETAL:  No edema; No deformity  SKIN: Warm and dry NEUROLOGIC:  Alert and oriented x 3 PSYCHIATRIC:  Normal affect    Signed, Norman HerrlichBrian Munley, MD  09/27/2017 3:54 PM    [facility]

## 2017-09-27 ENCOUNTER — Encounter: Payer: Self-pay | Admitting: Neurology

## 2017-09-27 ENCOUNTER — Encounter: Payer: Self-pay | Admitting: Cardiology

## 2017-09-27 ENCOUNTER — Ambulatory Visit (INDEPENDENT_AMBULATORY_CARE_PROVIDER_SITE_OTHER): Payer: BLUE CROSS/BLUE SHIELD | Admitting: Cardiology

## 2017-09-27 ENCOUNTER — Ambulatory Visit (INDEPENDENT_AMBULATORY_CARE_PROVIDER_SITE_OTHER): Payer: BLUE CROSS/BLUE SHIELD | Admitting: Neurology

## 2017-09-27 VITALS — BP 144/91 | HR 68 | Ht 62.0 in | Wt 325.0 lb

## 2017-09-27 VITALS — BP 130/78 | HR 72 | Ht 62.0 in | Wt 324.0 lb

## 2017-09-27 DIAGNOSIS — E662 Morbid (severe) obesity with alveolar hypoventilation: Secondary | ICD-10-CM | POA: Diagnosis not present

## 2017-09-27 DIAGNOSIS — Z79899 Other long term (current) drug therapy: Secondary | ICD-10-CM

## 2017-09-27 DIAGNOSIS — R002 Palpitations: Secondary | ICD-10-CM

## 2017-09-27 DIAGNOSIS — Z7901 Long term (current) use of anticoagulants: Secondary | ICD-10-CM | POA: Diagnosis not present

## 2017-09-27 DIAGNOSIS — F458 Other somatoform disorders: Secondary | ICD-10-CM

## 2017-09-27 DIAGNOSIS — I480 Paroxysmal atrial fibrillation: Secondary | ICD-10-CM

## 2017-09-27 DIAGNOSIS — E669 Obesity, unspecified: Secondary | ICD-10-CM | POA: Diagnosis not present

## 2017-09-27 DIAGNOSIS — I10 Essential (primary) hypertension: Secondary | ICD-10-CM

## 2017-09-27 DIAGNOSIS — G4730 Sleep apnea, unspecified: Secondary | ICD-10-CM | POA: Diagnosis not present

## 2017-09-27 DIAGNOSIS — Z6841 Body Mass Index (BMI) 40.0 and over, adult: Secondary | ICD-10-CM | POA: Diagnosis not present

## 2017-09-27 MED ORDER — METOPROLOL TARTRATE 25 MG PO TABS: 25.0000 mg | ORAL_TABLET | Freq: Four times a day (QID) | ORAL | 2 refills | Status: DC | PRN

## 2017-09-27 NOTE — Progress Notes (Signed)
SLEEP MEDICINE CLINIC   Provider:  Melvyn Novas, [place] D  Primary Care Physician:  Noni Saupe, MD   Referring Provider: Glenna Fellows, MD   Chief Complaint  Patient presents with  . New Patient (Initial Visit)    pt alone rm 10. pt states that she has a hard time getting to sleep. pt has been told she snores.Marland Kitchen    HPI:  Nancy Wu is a 51 y.o. female , seen here as in a referral  from Dr. Johna Sheriff for a sleep evaluation.   Mrs. Annas is a 51 year old right-handed Caucasian female patient of Dr. Glenna Fellows, presenting here in preparation for a weight loss surgery. She also has a history of long-standing headaches that preceded her weight gain. She has been exceeding a body mass index of 50 for the last 20 years. Weight gain  was not related to pregnancy, medication or any interventional procedure or any medical event. She has a history of progressive obesity since her early 99s despite multiple attempts at medical management and dietary control. She also has put on an additional 20 pounds through the year 2017 after she lost her son. Obesity has affected the patient causing restricted mobility and chronic knee pain, she had GERD, hypertension, osteoarthritis and atrial fibrillation on Eliquis.  The patient has been told that she snores, she has woken herself up frequently by snoring, she also experiences aerophagia.  She develops abdominal pain from air swallowing when sleeping in supine, and she has described burping for a long time when she turns to her side. She denies any history of dysphagia.   Sleep habits are as follows: The patient usually retreats to the bedroom around 10 PM, after bringing her children to bed and after reading. She mostly falls asleep on her side but may find herself later on her back.  Her bedroom is cool, quite and dark- shared with her husband. She sleeps on 2 pillows, and usually can sleep through from 10 to 5:30 AM, with nocturia  once or none.  She rises at 5.30 and 7 AM. She doesn't work outside the home. Her husband is disabled and not gainfully employed, had an amputation. His hospital bed was very comfortable when she used to sleep in it, with the head elevated.    Sleep medical history and family sleep history: The patient suffered from night terrors during her childhood but was not a sleep walker. She endorsed chest pain, depression with anxiety, atrial fibrillation, back pain, GERD, hemorrhoids, hypertension, kidney stones in the past, migraine headaches are present, thyroid disease, no cancer or malignancy.   Social history: re-married 18 plus years, mother of 2, one living . Lost her 77 year old son to an overdose. Daughter turning 75, adopted son is 37. Non smoking since 2006, no alcohol, caffeine ; she drinks at least 28 ounces of coffee in the morning, and may drink coffee later in the day, no soda nor ice tea and no energy drinks.   Review of Systems: Snoring, cardiac palpitations, depression, insomnia, restless legs at times. Out of a complete 14 system review, the patient complains of only the following symptoms, and all other reviewed systems are negative.   Epworth score 11 , Fatigue severity score 39  , depression score  n/a    Social History   Social History  . Marital status: Married    Spouse name: N/A  . Number of children: N/A  . Years of education: N/A   Occupational  History  . Not on file.   Social History Main Topics  . Smoking status: Former Games developermoker  . Smokeless tobacco: Never Used  . Alcohol use Yes     Comment: Occasional.  . Drug use: No  . Sexual activity: Not Currently    Partners: Male    Birth control/ protection: IUD   Other Topics Concern  . Not on file   Social History Narrative  . No narrative on file    Family History  Problem Relation Age of Onset  . Depression Mother   . Hypertension Mother   . Stroke Father   . Hypertension Father   . Melanoma Father    . Cancer Other   . Hypertension Other   . Stroke Other   . Heart disease Other   . Diabetes Other   . Depression Sister   . Alcohol abuse Brother     Past Medical History:  Diagnosis Date  . ALLERGIC RHINITIS 08/20/2007   Qualifier: Diagnosis of  By: Daphine DeutscherMartin FNP, Zena AmosNykedtra    . Anxiety   . Borderline diabetes   . Chest pain 05/08/2013   Normal coronary arteriography in November 2017  . Chronic low back pain 01/25/2007   Qualifier: Diagnosis of  By: Bradly BienenstockHarrelson, Kathy    . Depression   . Gastroesophageal reflux disease without esophagitis 09/29/2016  . Heart disease    afib  . High risk medication use 11/02/2016   Overview:  Multaq  . Hypertension   . HYPERTENSION, BENIGN SYSTEMIC 01/25/2007   Qualifier: Diagnosis of  By: Bradly BienenstockHarrelson, Kathy    . Hypertensive heart disease without heart failure 09/29/2016  . Ingrown toenail 10/21/2012  . IUD 2007  . Lumbago with sciatica of left side   . Migraine 07/22/2011  . Morbid obesity (HCC)   . Obesity 01/25/2007   Qualifier: Diagnosis of  By: Bradly BienenstockHarrelson, Kathy    . Paroxysmal atrial fibrillation (HCC) 09/29/2016  . PRESENCE OF IUD 11/28/2004   Qualifier: Diagnosis of  By: Daphine DeutscherMartin FNP, Zena AmosNykedtra    . Reactive depression 01/25/2007   Qualifier: Diagnosis of  By: Bradly BienenstockHarrelson, Kathy    . Right knee meniscal tear 12/15/2011  . Sciatica 01/25/2007   Qualifier: Diagnosis of  By: Bradly BienenstockHarrelson, Kathy      Past Surgical History:  Procedure Laterality Date  . CARDIAC CATHETERIZATION  09/29/2016  . CESAREAN SECTION  1989  . CHOLECYSTECTOMY  2010  . KNEE SURGERY     Bilateral.  . TUBAL LIGATION  1989    Current Outpatient Prescriptions  Medication Sig Dispense Refill  . apixaban (ELIQUIS) 5 MG TABS tablet Take 2.5 mg by mouth 2 (two) times daily.    Marland Kitchen. b complex vitamins tablet Take 1 tablet by mouth daily.    Marland Kitchen. buPROPion (WELLBUTRIN XL) 300 MG 24 hr tablet Take 300 mg by mouth daily.    Marland Kitchen. lisinopril (PRINIVIL,ZESTRIL) 10 MG tablet Take 10 mg by mouth  daily.    . MULTAQ 400 MG tablet Take 400 mg by mouth 2 (two) times daily.  0  . Multiple Vitamins-Minerals (ONE-A-DAY 50 PLUS) TABS Take 1 tablet by mouth daily.      . norethindrone (MICRONOR,CAMILA,ERRIN) 0.35 MG tablet Take 1 tablet (0.35 mg total) by mouth daily. 1 Package 11  . gabapentin (NEURONTIN) 300 MG capsule Take 1 capsule (300 mg total) by mouth 4 (four) times daily. (Patient taking differently: Take 300 mg by mouth 3 (three) times daily. ) 120 capsule 11   No current  facility-administered medications for this visit.     Allergies as of 09/27/2017 - Review Complete 09/27/2017  Allergen Reaction Noted  . Codeine Nausea Only     Vitals: BP (!) 144/91   Pulse 68   Ht 5\' 2"  (1.575 m)   Wt (!) 325 lb (147.4 kg)   BMI 59.44 kg/m  Last Weight:  Wt Readings from Last 1 Encounters:  09/27/17 (!) 325 lb (147.4 kg)   RUE:AVWU mass index is 59.44 kg/m.     Last Height:   Ht Readings from Last 1 Encounters:  09/27/17 5\' 2"  (1.575 m)    Physical exam:  General: The patient is awake, alert and appears not in acute distress. The patient is well groomed. Head: Normocephalic, atraumatic. Neck is supple. Mallampati 4,  neck circumference: 16. 5 . Nasal airflow patent,  Retrognathia is not seen. Crowded lower jaw.  Cardiovascular:  irregular rate and rhythm , without  murmurs or carotid bruit, and without distended neck veins. Respiratory: Lungs are clear to auscultation. Skin:  Without evidence of edema, or rash.  Trunk: BMI is 60. The patient's posture is erect.  Neurologic exam : The patient is awake and alert, oriented to place and time.  Attention span & concentration ability appears normal.  Speech is fluent,  without dysarthria, dysphonia or aphasia. Mood and affect are appropriate.  Cranial nerves: Pupils are equal and briskly reactive to light. Funduscopic exam without evidence of pallor or edema. Extraocular movements  in vertical and horizontal planes intact and  without nystagmus.  Visual fields by finger perimetry are intact. Hearing to finger rub intact. Facial sensation intact to fine touch. Facial motor strength is symmetric and tongue and uvula move midline. Shoulder shrug was symmetrical.   Motor exam:   Normal tone, muscle bulk and symmetric strength in all extremities. Sensory:  Fine touch, pinprick and vibration were tested in all extremities. Proprioception tested in the upper extremities was normal. Coordination: Rapid alternating movements in the fingers/hands was normal. Finger-to-nose maneuver  normal without evidence of ataxia, dysmetria or tremor. Gait and station: Patient walks without assistive device and is able unassisted to climb up to the exam table. Strength within normal limits. Arthralgic gait. Stance is wide based and normal.  Tandem gait deferred-  Turns with 4 Steps.  Deep tendon reflexes: in the  upper and lower extremities are symmetric and intact. Babinski maneuver response is downgoing.    Assessment:  After physical and neurologic examination, review of laboratory studies,  Personal review of imaging studies, reports of other /same  Imaging studies, results of polysomnography and / or neurophysiology testing and pre-existing records as far as provided in visit., my assessment is   1)  Very high risk for obesity hypoventilation. BMI is 60- and the patient has atrial fibrillation, had transient CHF after bout of rapid ventricular response. This patient 's co-morbidities make an attended sleep study necessary.   2) Snoring and witnessed apnea- OSA? This I needs to be evaluated ASAP before she undergoes gastric bypass surgery/   3) super obesity. Aerophagia reported, likely due to diaphragmatic weakness and low pressure environment in supine sleep- causes air influx when turning to the side. This condition cannot be verified in a HST,    The patient was advised of the nature of the diagnosed disorder , the treatment options  and the  risks for general health and wellness arising from not treating the condition.   I spent more than 50 minutes of face to  face time with the patient.  Greater than 50% of time was spent in counseling and coordination of care. We have discussed the diagnosis and differential and I answered the patient's questions.    Plan:  Treatment plan and additional workup : URGENT split night PSG with CO2,     Melvyn Novas, MD 09/27/2017, 1:08 PM  Certified in Neurology by ABPN Certified in Sleep Medicine by [facility] [address] [place], [place] [address]

## 2017-09-27 NOTE — Patient Instructions (Addendum)
Medication Instructions:  Your physician has recommended you make the following change in your medication:  START metoprolol (Lopressor) 25 mg every hours as needed for heart rate greater than 120.  Labwork: None  Testing/Procedures: None  Follow-Up: Your physician wants you to follow-up in: 9 months. You will receive a reminder letter in the mail two months in advance. If you don't receive a letter, please call our office to schedule the follow-up appointment.  Any Other Special Instructions Will Be Listed Below (If Applicable).     If you need a refill on your cardiac medications before your next appointment, please call your pharmacy.    1. Avoid all over-the-counter antihistamines except Claritin/Loratadine and Zyrtec/Cetrizine. 2. Avoid all combination including cold sinus allergies flu decongestant and sleep medications 3. You can use Robitussin DM Mucinex and Mucinex DM for cough. 4. can use Tylenol aspirin ibuprofen and naproxen but no combinations such as sleep or sinus.  KardiaMobile [email or web address]        FDA-cleared, clinical grade mobile EKG monitor: Lourena SimmondsKardia is the most clinically-validated mobile EKG used by the world's leading cardiac care medical professionals With Basic service, know instantly if your heart rhythm is normal or if atrial fibrillation is detected, and email the last single EKG recording to yourself or your doctor Premium service, available for purchase through the Kardia app for $9.99 per month or $99 per year, includes unlimited history and storage of your EKG recordings, a monthly EKG summary report to share with your doctor, along with the ability to track your blood pressure, activity and weight Includes one KardiaMobile phone clip FREE SHIPPING: Standard delivery 1-3 business days. Orders placed by 11:00am PST will ship that afternoon. Otherwise, will ship next business day. All orders ship via [employer] Priority Mail  from [place]

## 2017-09-27 NOTE — Patient Instructions (Signed)
Obesity Hypoventilation Syndrome Obesity hypoventilation syndrome (OHS) means that you are not breathing well enough to get air in and out of your lungs efficiently (ventilation). This causes a low oxygen level and a high carbon dioxide level in your blood (hypoventilation). Having too much total body fat (obesity) is a significant risk factor for developing OHS. OHS makes it harder for your heart to pump oxygen-rich blood to your body. It can cause sleep disturbances and make you feel sleepy during the day. Over time, OHS can increase your risk for:  Heart disease.  High blood pressure (hypertension).  Reduced ability to absorb sugar from the bloodstream (insulin resistance).  Heart failure. Over time, OHS weakens your heart and can lead to heart failure.  What are the causes? The exact cause of OHS is not known. Possible causes include:  Pressure on the lungs from excess body weight.  Obesity-related changes in how much air the lungs can hold (lung capacity) and how much they can expand (lung compliance).  Failure of the brain to regulate oxygen and carbon dioxide levels properly.  Chemicals (hormones) produced by excess fat cells interfering with breathing regulation.  A breathing condition in which breathing pauses or becomes shallow during sleep (sleep apnea). This condition can eventually cause the body to ventilate poorly and to hold onto carbon dioxide during the day.  What increases the risk? You may have a greater risk for OHS if you:  Have a BMI of 30 or higher. BMI is an estimate of body fat that is calculated from height and weight. For adults, a BMI of 30 or higher is considered obese.  Are 40?50 years old.  Carry most of your excess weight around your waist.  Experience moderate symptoms of sleep apnea.  What are the signs or symptoms? The most common symptoms of OHS are:  Daytime sleepiness.  Lack of energy.  Shortness of breath.  Morning  headaches.  Sleep apnea.  Trouble concentrating.  Irritability, mood swings, or depression.  Swollen veins in the neck.  Swelling of the legs.  How is this diagnosed? Your health care provider may suspect OHS if you are obese and have poor breathing during the day and at night. Your health care provider will also do a physical exam. You may have tests to:  Measure your BMI.  Measure your blood oxygen level with a sensor placed on your finger (pulse oximetry).  Measure blood oxygen and carbon dioxide in a blood sample.  Measure the amount of red blood cells in a blood sample. OHS causes the number of red blood cells you have to increase (polycythemia).  Check your breathing ability (pulmonary function testing).  Check your breathing ability, breathing patterns, and oxygen level while you sleep (sleep study).  You may also have a chest X-ray to rule out other breathing problems. You may have an electrocardiogram (ECG) and or echocardiogram to check for signs of heart failure. How is this treated? Weight loss is the most important part of treatment for OHS, and it may be the only treatment that you need. Other treatments may include:  Using a device to open your airway while you sleep, such as a continuous positive airway pressure (CPAP) machine that delivers oxygen to your airway through a mask.  Surgery (gastric bypass surgery) to lower your BMI. This may be needed if: ? You are very obese. ? Other treatments have not worked for you. ? Your OHS is very severe and is causing organ damage, such as   heart failure.  Follow these instructions at home: Medicines  Take over-the-counter and prescription medicines only as told by your health care provider.  Ask your health care provider what medicines are safe for you. You may be told to avoid medicines that can impair breathing and make OHS worse, such as sedatives and narcotics. Sleeping habits  If you are prescribed a CPAP  machine, make sure you understand and use the machine as directed.  Try to get 8 hours of sleep every night.  Go to bed at the same time every night, and get up at the same time every day. General instructions  Work with your health care provider to make a diet and exercise plan that helps you reach and maintain a healthy weight.  Eat a healthy diet.  Avoid smoking.  Exercise regularly as told by your health care provider.  During the evening, do not drink caffeine and do not eat heavy meals.  Keep all follow-up visits as told by your health care provider. This is important. Contact a health care provider if:   You experience new or worsening shortness of breath.  You have chest pain.  You have an irregular heartbeat (palpitations).  You have dizziness.  You faint.  You develop a cough.  You have a fever.  You have chest pain when you breathe (pleurisy). This information is not intended to replace advice given to you by your health care provider. Make sure you discuss any questions you have with your health care provider. Document Released: 04/25/2016 Document Revised: 06/03/2016 Document Reviewed: 04/25/2016 Elsevier Interactive Patient Education  2018 [employer]  

## 2017-11-02 ENCOUNTER — Ambulatory Visit (INDEPENDENT_AMBULATORY_CARE_PROVIDER_SITE_OTHER): Payer: BLUE CROSS/BLUE SHIELD | Admitting: Neurology

## 2017-11-02 DIAGNOSIS — E669 Obesity, unspecified: Secondary | ICD-10-CM

## 2017-11-02 DIAGNOSIS — R002 Palpitations: Secondary | ICD-10-CM

## 2017-11-02 DIAGNOSIS — I480 Paroxysmal atrial fibrillation: Secondary | ICD-10-CM

## 2017-11-02 DIAGNOSIS — Z6841 Body Mass Index (BMI) 40.0 and over, adult: Secondary | ICD-10-CM

## 2017-11-02 DIAGNOSIS — G4730 Sleep apnea, unspecified: Secondary | ICD-10-CM

## 2017-11-02 DIAGNOSIS — F458 Other somatoform disorders: Secondary | ICD-10-CM

## 2017-11-02 DIAGNOSIS — E662 Morbid (severe) obesity with alveolar hypoventilation: Secondary | ICD-10-CM

## 2017-11-14 ENCOUNTER — Telehealth: Payer: Self-pay | Admitting: Neurology

## 2017-11-14 ENCOUNTER — Other Ambulatory Visit: Payer: Self-pay | Admitting: Neurology

## 2017-11-14 DIAGNOSIS — I480 Paroxysmal atrial fibrillation: Secondary | ICD-10-CM

## 2017-11-14 DIAGNOSIS — Z6841 Body Mass Index (BMI) 40.0 and over, adult: Secondary | ICD-10-CM

## 2017-11-14 DIAGNOSIS — E662 Morbid (severe) obesity with alveolar hypoventilation: Secondary | ICD-10-CM

## 2017-11-14 HISTORY — DX: Morbid (severe) obesity due to excess calories: E66.01

## 2017-11-14 HISTORY — DX: Morbid (severe) obesity with alveolar hypoventilation: E66.2

## 2017-11-14 NOTE — Telephone Encounter (Signed)
I called pt. I advised pt that Dr. Vickey Hugerohmeier reviewed their sleep study results and found that pt has sleep apnea. Dr. Vickey Hugerohmeier recommends that pt starts a auto CPAP. I reviewed PAP compliance expectations with the pt. Pt is agreeable to starting an auto-PAP. I advised pt that an order will be sent to a DME, [facility], and [facility] will call the pt within about one week after they file with the pt's insurance. [facility] will show the pt how to use the machine, fit for masks, and troubleshoot the auto-PAP if needed. A follow up appt was made for insurance purposes with Darrol Angelarolyn Martin, NP on February 12, 2018 at 3:15 pm. Pt verbalized understanding to arrive 15 minutes early and bring their auto-PAP. A letter with all of this information in it will be mailed to the pt as a reminder. I verified with the pt that the address we have on file is correct. Pt verbalized understanding of results. Pt had no questions at this time but was encouraged to call back if questions arise.

## 2017-11-14 NOTE — Telephone Encounter (Signed)
-----   Message from Melvyn Novasarmen Dohmeier, MD sent at 11/14/2017  1:21 PM EST ----- Mild overall apnea, but severe hypoxemia. CO2 data were not obtained- I will investigate why. 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  REM dependent sleep apnea need CPAP- I ordered auto PAP 5-15 cm water, 3 cm EPR, mask to be fitted.

## 2017-11-14 NOTE — Procedures (Signed)
PATIENT'S NAME:  Nancy Wu, Nancy Wu DOB:      09-15-1966      MR#:    [id number]     DATE OF RECORDING: 11/02/2017 REFERRING M.D.:  Glenna FellowsBenjamin Hoxworth, M.D. Study Performed:   Baseline Polysomnogram HISTORY: Mrs. Ephriam KnucklesChristian is a [age] year old right-handed, Caucasian female and patient of Dr. Sharlet SalinaBenjamin Hoxworth's, presenting here in preparation for a weight loss surgery. She also has a history of long-standing headaches that preceded her weight gain to now BMI at 60- [facility]. Obesity has affected the patient causing restricted mobility and chronic knee pain, she has GERD, hypertension, osteoarthritis and has atrial fibrillation on Eliquis. The patient endorsed the Epworth Sleepiness Scale at 11 points. FSS at 34 points.   The patient's weight 325 pounds with a height of 62 (inches), resulting in a BMI of 59.6 kg/m2. The patient's neck circumference measured 16.5 inches.  CURRENT MEDICATIONS: Eliquis, Vitamin B, Wellbutrin, Zestril, Multaq, One A Day vitamin 50 plus, Micronor, Neurontin   PROCEDURE:  This is a multichannel digital polysomnogram utilizing the Somnostar 11.2 system.  Electrodes and sensors were applied and monitored per AASM Specifications.   EEG, EOG, Chin and Limb EMG, were sampled at 200 Hz.  ECG, Snore and Nasal Pressure, Thermal Airflow, Respiratory Effort, CPAP Flow and Pressure, Oximetry was sampled at 50 Hz. Digital video and audio were recorded.      BASELINE STUDY:  Lights Out was at 22:29 and Lights On at 04:59.  Total recording time (TRT) was 390.5 minutes, with a total sleep time (TST) of 347.5 minutes.  The patient's sleep latency was 29 minutes.  REM latency was 160.5 minutes. The sleep efficiency was 89.0 %.     SLEEP ARCHITECTURE: WASO (Wake after sleep onset) was 32 minutes.  There were 12 minutes in Stage N1, 193.5 minutes Stage N2, 95 minutes Stage N3 and 47 minutes in Stage REM.  The percentage of Stage N1 was 3.5%, Stage N2 was 55.7%, Stage N3 was 27.3% and Stage  R (REM sleep) was 13.5%.   RESPIRATORY ANALYSIS:  There were a total of 54 respiratory events:  9 obstructive apneas, 0 central apneas and 0 mixed apneas with a total of 9 apneas and an apnea index (AI) of 1.6 /hour. There were 45 hypopneas with a hypopnea index of 7.8 /hour. The patient also had 3 respiratory event related arousals (RERAs).  The total APNEA/HYPOPNEA INDEX (AHI) was 9.3/hour and the total RESPIRATORY DISTURBANCE INDEX was 9.8 /hour.  24 events occurred in REM sleep and 52 events in NREM. The REM AHI was 30.6 /hour, versus a non-REM AHI of 6.0. The patient spent 277.5 minutes of total sleep time in the supine position and 70 minutes in non-supine. The supine AHI was 8.0 versus a non-supine AHI of 14.6.  OXYGEN SATURATION & C02:  The Wake baseline 02 saturation was 96%, with the lowest being 72%. Time spent below 89% saturation equaled 51 minutes. CO2 measures were requested, but not performed.    PERIODIC LIMB MOVEMENTS:  The patient had a total of 0 Periodic Limb Movements.  The arousals were noted as: 37 were spontaneous, 0 were associated with PLMs, and 27 were associated with respiratory events. Audio and video analysis did not show any abnormal or unusual movements, behaviors, phonations or vocalizations.  EKG in NSR.  Snoring was noted. Post-study, the patient indicated that sleep was better than usual.   IMPRESSION:  1. Obstructive Sleep Apnea (OSA), mild overall,  with accentuation during REM sleep.  2. Sleep hypoxemia with Sp02 nadir at 72% and with over 50 minutes in total desaturation time- this can clinically contribute to the patient's headache complaint.  3. Unfortunately, CO2 was not measured- but obesity hypoventilation in a super obese patient is suspected. 4. History of atrial fibrillation.   RECOMMENDATIONS:  1. CPAP therapy is needed due to this patient's apnea associated with hypoxemia and likely hypercapnia. This type of OSA cannot be treated by a dental  device. The patient may still have hypoxemia on CPAP, we will send an order for auto-titration and overnight oximetry on CPAP. 2. Further information regarding OSA may be obtained from [employer] (www.sleepfoundation.org) or American Sleep Apnea Association (www.sleepapnea.org). 3. A follow up [facility]. The referring provider will be notified of the results.      I certify that I have reviewed the entire raw data recording prior to the issuance of this report in accordance with the Standards of Accreditation of the American Academy of Sleep Medicine (AASM)    Melvyn Novasarmen Zoriana Oats, MD   11-14-2017  Diplomat, American Board of Psychiatry and Neurology  Diplomat, American Board of Sleep Medicine Medical Director, [facility]

## 2017-11-22 ENCOUNTER — Other Ambulatory Visit: Payer: Self-pay

## 2017-11-22 MED ORDER — APIXABAN 5 MG PO TABS: 5.0000 mg | ORAL_TABLET | Freq: Two times a day (BID) | ORAL | 2 refills | Status: DC

## 2017-12-28 ENCOUNTER — Other Ambulatory Visit: Payer: Self-pay

## 2017-12-28 MED ORDER — MULTAQ 400 MG PO TABS: 400.0000 mg | ORAL_TABLET | Freq: Two times a day (BID) | ORAL | 2 refills | Status: DC

## 2018-01-11 ENCOUNTER — Other Ambulatory Visit: Payer: Self-pay | Admitting: Obstetrics and Gynecology

## 2018-02-08 ENCOUNTER — Telehealth: Payer: Self-pay | Admitting: Neurology

## 2018-02-08 NOTE — Telephone Encounter (Signed)
-----   Message from Mychart, Generic sent at 02/08/2018 9:33 AM EDT -----    Appointment Request From: Joni ReiningPamela Marsico    With Provider: Melvyn Novasarmen Dohmeier, MD [[facility]]    Preferred Date Range: Any date 03/07/2018 or later    Preferred Times: Any time    Reason for visit: Request an Appointment    Comments:  I missed the call to reschedule my appointment.

## 2018-02-08 NOTE — Progress Notes (Signed)
[facility]  PATIENT: Chloeann Alfred DOB: 1966-10-15   REASON FOR VISIT: Newly diagnosed Obstructive sleep apnea with initial CPAP HISTORY FROM: Patient    HISTORY OF PRESENT ILLNESS:UPDATE 3/18/2019CM Ms. Wiltsey, 52 year old female returns for follow-up with newly diagnosed obstructive sleep apnea here for initial CPAP compliance.  She denies issues with CPAP machine.  Compliance data dated 01/13/2018-02/11/2018 shows compliance greater than 4 hours at 87%.  Average usage 7 hours 19 minutes.  Pressure 5-15 cm.  EPR level 3.  AHI 1.2 leaks 95th percentile 5.7.  She returns for reevaluation  10/31/18CDMrs. Butsch is a 52 year old right-handed Caucasian female patient of Dr. Excell Seltzer, presenting here in preparation for a weight loss surgery. She also has a history of long-standing headaches that preceded her weight gain. She has been exceeding a body mass index of 50 for the last 20 years. Weight gain  was not related to pregnancy, medication or any interventional procedure or any medical event. She has a history of progressive obesity since her early 87s despite multiple attempts at medical management and dietary control. She also has put on an additional 20 pounds through the year 2017 after she lost her son. Obesity has affected the patient causing restricted mobility and chronic knee pain, she had GERD, hypertension, osteoarthritis and atrial fibrillation on Eliquis.  The patient has been told that she snores, she has woken herself up frequently by snoring, she also experiences aerophagia.  She develops abdominal pain from air swallowing when sleeping in supine, and she has described burping for a long time when she turns to her side. She denies any history of dysphagia.   Sleep habits are as follows: The patient usually retreats to the bedroom around 10 PM, after bringing her children to bed and after reading. She mostly falls asleep on her side but may  find herself later on her back.  Her bedroom is cool, quite and dark- shared with her husband. She sleeps on 2 pillows, and usually can sleep through from 10 to 5:30 AM, with nocturia once or none.  She rises at 5.30 and 7 AM. She doesn't work outside the home. Her husband is disabled and not gainfully employed, had an amputation. His hospital bed was very comfortable when she used to sleep in it, with the head elevated.    REVIEW OF SYSTEMS: Full 14 system review of systems performed and notable only for those listed, all others are neg:  Constitutional: neg  Cardiovascular: neg Ear/Nose/Throat: neg  Skin: neg Eyes: neg Respiratory: neg Gastroitestinal: neg  Hematology/Lymphatic: neg  Endocrine: neg Musculoskeletal:neg Allergy/Immunology: neg Neurological: neg Psychiatric: neg Sleep : Obstructive sleep apnea with CPAP ALLERGIES: Allergies  Allergen Reactions  . Codeine Nausea Only    REACTION: sick to stomach    HOME MEDICATIONS: Outpatient Medications Prior to Visit  Medication Sig Dispense Refill  . apixaban (ELIQUIS) 5 MG TABS tablet Take 1 tablet (5 mg total) by mouth 2 (two) times daily. 120 tablet 2  . b complex vitamins tablet Take 1 tablet by mouth daily.    Marland Kitchen buPROPion (WELLBUTRIN XL) 300 MG 24 hr tablet Take 300 mg by mouth daily.    Marland Kitchen gabapentin (NEURONTIN) 800 MG tablet Take 800 mg by mouth 3 (three) times daily.    Marland Kitchen lisinopril (PRINIVIL,ZESTRIL) 10 MG tablet Take 10 mg by mouth daily.    . metoprolol tartrate (LOPRESSOR) 25 MG tablet Take 1 tablet (25 mg total) by mouth every 6 (six) hours as needed. For  AF rate > 120 BPM 30 tablet 2  . MULTAQ 400 MG tablet Take 1 tablet (400 mg total) by mouth 2 (two) times daily. 180 tablet 2  . Multiple Vitamins-Minerals (ONE-A-DAY 50 PLUS) TABS Take 1 tablet by mouth daily.      . norethindrone (MICRONOR,CAMILA,ERRIN) 0.35 MG tablet TAKE 1 TABLET BY MOUTH EVERY DAY 28 tablet 10   No facility-administered medications prior to  visit.     PAST MEDICAL HISTORY: Past Medical History:  Diagnosis Date  . ALLERGIC RHINITIS 08/20/2007   Qualifier: Diagnosis of  By: Martin FNP, Nykedtra    . Anxiety   . Borderline diabetes   . Chest pain 05/08/2013   Normal coronary arteriography in November 2017  . Chronic low back pain 01/25/2007   Qualifier: Diagnosis of  By: Harrelson, Kathy    . Depression   . Gastroesophageal reflux disease without esophagitis 09/29/2016  . Heart disease    afib  . High risk medication use 11/02/2016   Overview:  Multaq  . Hypertension   . HYPERTENSION, BENIGN SYSTEMIC 01/25/2007   Qualifier: Diagnosis of  By: Harrelson, Kathy    . Hypertensive heart disease without heart failure 09/29/2016  . Ingrown toenail 10/21/2012  . IUD 2007  . Lumbago with sciatica of left side   . Migraine 07/22/2011  . Morbid obesity (HCC)   . Obesity 01/25/2007   Qualifier: Diagnosis of  By: Harrelson, Kathy    . Paroxysmal atrial fibrillation (HCC) 09/29/2016  . PRESENCE OF IUD 11/28/2004   Qualifier: Diagnosis of  By: Martin FNP, Nykedtra    . Reactive depression 01/25/2007   Qualifier: Diagnosis of  By: Harrelson, Kathy    . Right knee meniscal tear 12/15/2011  . Sciatica 01/25/2007   Qualifier: Diagnosis of  By: Harrelson, Kathy      PAST SURGICAL HISTORY: Past Surgical History:  Procedure Laterality Date  . CARDIAC CATHETERIZATION  09/29/2016  . CESAREAN SECTION  1989  . CHOLECYSTECTOMY  2010  . KNEE SURGERY     Bilateral.  . TUBAL LIGATION  1989    FAMILY HISTORY: Family History  Problem Relation Age of Onset  . Depression Mother   . Hypertension Mother   . Stroke Father   . Hypertension Father   . Melanoma Father   . Cancer Other   . Hypertension Other   . Stroke Other   . Heart disease Other   . Diabetes Other   . Depression Sister   . Alcohol abuse Brother     SOCIAL HISTORY: Social History   Socioeconomic History  . Marital status: Married    Spouse name: Not on file  . Number  of children: Not on file  . Years of education: Not on file  . Highest education level: Not on file  Social Needs  . Financial resource strain: Not on file  . Food insecurity - worry: Not on file  . Food insecurity - inability: Not on file  . Transportation needs - medical: Not on file  . Transportation needs - non-medical: Not on file  Occupational History  . Not on file  Tobacco Use  . Smoking status: Former Smoker  . Smokeless tobacco: Never Used  Substance and Sexual Activity  . Alcohol use: Yes    Comment: Occasional.  . Drug use: No  . Sexual activity: Not Currently    Partners: Male    Birth control/protection: IUD  Other Topics Concern  . Not on file  Social History Narrative  .   Not on file     PHYSICAL EXAM  Vitals:   02/12/18 1444  BP: 140/73  Pulse: 70  Weight: (!) 337 lb 9.6 oz (153.1 kg)   Body mass index is 61.75 kg/m.  Generalized: Well developed, morbidly obese female in no acute distress  Head: normocephalic and atraumatic,. Oropharynx benign  Neck: Supple,  Musculoskeletal: No deformity   Neurological examination   Mentation: Alert oriented to time, place, history taking. Attention span and concentration appropriate. Recent and remote memory intact.  Follows all commands speech and language fluent.   Cranial nerve II-XII: Pupils were equal round reactive to light extraocular movements were full, visual field were full on confrontational test. Facial sensation and strength were normal. hearing was intact to finger rubbing bilaterally. Uvula tongue midline. head turning and shoulder shrug were normal and symmetric.Tongue protrusion into cheek strength was normal. Motor: normal bulk and tone, full strength in the BUE, BLE,  Sensory: normal and symmetric to light touch,  Coordination: finger-nose-finger, heel-to-shin bilaterally, no dysmetria Gait and Station: Rising up from seated position without assistance, normal stance, no difficulty with turns  no assistive device   DIAGNOSTIC DATA (LABS, IMAGING, TESTING) - I reviewed patient records, labs, notes, testing and imaging myself where available.  Lab Results  Component Value Date   WBC 7.1 05/08/2013   HGB 13.0 05/08/2013   HCT 39.9 05/08/2013   MCV 83.0 05/08/2013   PLT 314 05/08/2013      Component Value Date/Time   NA 138 05/08/2013 1636   K 4.2 05/08/2013 1636   CL 97 05/08/2013 1636   CO2 32 05/08/2013 1636   GLUCOSE 89 05/08/2013 1636   BUN 11 05/08/2013 1636   CREATININE 0.79 05/08/2013 1636   CALCIUM 9.7 05/08/2013 1636   PROT 7.1 05/08/2013 1636   ALBUMIN 4.1 05/08/2013 1636   AST 16 05/08/2013 1636   ALT 23 05/08/2013 1636   ALKPHOS 53 05/08/2013 1636   BILITOT 0.3 05/08/2013 1636   GFRNONAA >60 12/21/2008 2317   GFRAA  12/21/2008 2317    >60        The eGFR has been calculated using the MDRD equation. This calculation has not been validated in all clinical situations. eGFR's persistently <60 mL/min signify possible Chronic Kidney Disease.     ASSESSMENT AND PLAN  52 y.o. year old female  has a past medical history of Morbid obesity ([place]), and new lead diagnosed obstructive sleep apnea here for initial CPAP compliance.Data dated 01/13/2018-02/11/2018 shows compliance greater than 4 hours at 87%.  Average usage 7 hours 19 minutes.  Pressure 5-15 cm.  EPR level 3.  AHI 1.2 leaks 95th percentile 5.7.   CPAP compliance greater than 4 hours 87%  Continue same settings Follow-up in 6 months Dennie Bible, [facility], [facility], APRN  [facility] [address], [place], [place] [address] [phone]

## 2018-02-08 NOTE — Telephone Encounter (Signed)
Called the patient. The purpose of the apt was for her to be seen within 90 days of her starting her CPAP machine. The patient 90 day mark is 3/28. I called her to let her know that if she isn't seen within that 90 day time frame a lot of time insurance requires starting the whole process over. The patient states that she will take her apt back for Monday and will try and come up with the money for her apt. She was appreciative for us calling and reminding her that is why she had to be seen.

## 2018-02-11 ENCOUNTER — Encounter: Payer: Self-pay | Admitting: Nurse Practitioner

## 2018-02-12 ENCOUNTER — Ambulatory Visit: Payer: BLUE CROSS/BLUE SHIELD | Admitting: Nurse Practitioner

## 2018-02-12 ENCOUNTER — Ambulatory Visit: Payer: Self-pay | Admitting: Nurse Practitioner

## 2018-02-12 ENCOUNTER — Encounter: Payer: Self-pay | Admitting: Nurse Practitioner

## 2018-02-12 DIAGNOSIS — G4733 Obstructive sleep apnea (adult) (pediatric): Secondary | ICD-10-CM | POA: Insufficient documentation

## 2018-02-12 DIAGNOSIS — Z9989 Dependence on other enabling machines and devices: Secondary | ICD-10-CM

## 2018-02-12 HISTORY — DX: Obstructive sleep apnea (adult) (pediatric): G47.33

## 2018-02-12 NOTE — Progress Notes (Signed)
I agree with the assessment and plan as directed by NP .The patient is known to me . Please add Mallampati and neck circumference and a note if the patient has seen a benefit from CPAP use.    Garrison Michie, MD

## 2018-02-12 NOTE — Patient Instructions (Signed)
CPAP compliance greater than 4 hours 87%  Continue same settings Follow-up in 6 months

## 2018-08-14 NOTE — Progress Notes (Deleted)
[facility]  PATIENT: Chloeann Alfred DOB: 1966-10-15   REASON FOR VISIT: Newly diagnosed Obstructive sleep apnea with initial CPAP HISTORY FROM: Patient    HISTORY OF PRESENT ILLNESS:UPDATE 3/18/2019CM Ms. Wiltsey, 52 year old female returns for follow-up with newly diagnosed obstructive sleep apnea here for initial CPAP compliance.  She denies issues with CPAP machine.  Compliance data dated 01/13/2018-02/11/2018 shows compliance greater than 4 hours at 87%.  Average usage 7 hours 19 minutes.  Pressure 5-15 cm.  EPR level 3.  AHI 1.2 leaks 95th percentile 5.7.  She returns for reevaluation  10/31/18CDMrs. Butsch is a 52 year old right-handed Caucasian female patient of Dr. Excell Seltzer, presenting here in preparation for a weight loss surgery. She also has a history of long-standing headaches that preceded her weight gain. She has been exceeding a body mass index of 50 for the last 20 years. Weight gain  was not related to pregnancy, medication or any interventional procedure or any medical event. She has a history of progressive obesity since her early 87s despite multiple attempts at medical management and dietary control. She also has put on an additional 20 pounds through the year 2017 after she lost her son. Obesity has affected the patient causing restricted mobility and chronic knee pain, she had GERD, hypertension, osteoarthritis and atrial fibrillation on Eliquis.  The patient has been told that she snores, she has woken herself up frequently by snoring, she also experiences aerophagia.  She develops abdominal pain from air swallowing when sleeping in supine, and she has described burping for a long time when she turns to her side. She denies any history of dysphagia.   Sleep habits are as follows: The patient usually retreats to the bedroom around 10 PM, after bringing her children to bed and after reading. She mostly falls asleep on her side but may  find herself later on her back.  Her bedroom is cool, quite and dark- shared with her husband. She sleeps on 2 pillows, and usually can sleep through from 10 to 5:30 AM, with nocturia once or none.  She rises at 5.30 and 7 AM. She doesn't work outside the home. Her husband is disabled and not gainfully employed, had an amputation. His hospital bed was very comfortable when she used to sleep in it, with the head elevated.    REVIEW OF SYSTEMS: Full 14 system review of systems performed and notable only for those listed, all others are neg:  Constitutional: neg  Cardiovascular: neg Ear/Nose/Throat: neg  Skin: neg Eyes: neg Respiratory: neg Gastroitestinal: neg  Hematology/Lymphatic: neg  Endocrine: neg Musculoskeletal:neg Allergy/Immunology: neg Neurological: neg Psychiatric: neg Sleep : Obstructive sleep apnea with CPAP ALLERGIES: Allergies  Allergen Reactions  . Codeine Nausea Only    REACTION: sick to stomach    HOME MEDICATIONS: Outpatient Medications Prior to Visit  Medication Sig Dispense Refill  . apixaban (ELIQUIS) 5 MG TABS tablet Take 1 tablet (5 mg total) by mouth 2 (two) times daily. 120 tablet 2  . b complex vitamins tablet Take 1 tablet by mouth daily.    Marland Kitchen buPROPion (WELLBUTRIN XL) 300 MG 24 hr tablet Take 300 mg by mouth daily.    Marland Kitchen gabapentin (NEURONTIN) 800 MG tablet Take 800 mg by mouth 3 (three) times daily.    Marland Kitchen lisinopril (PRINIVIL,ZESTRIL) 10 MG tablet Take 10 mg by mouth daily.    . metoprolol tartrate (LOPRESSOR) 25 MG tablet Take 1 tablet (25 mg total) by mouth every 6 (six) hours as needed. For  AF rate > 120 BPM 30 tablet 2  . MULTAQ 400 MG tablet Take 1 tablet (400 mg total) by mouth 2 (two) times daily. 180 tablet 2  . Multiple Vitamins-Minerals (ONE-A-DAY 50 PLUS) TABS Take 1 tablet by mouth daily.      . norethindrone (MICRONOR,CAMILA,ERRIN) 0.35 MG tablet TAKE 1 TABLET BY MOUTH EVERY DAY 28 tablet 10   No facility-administered medications prior to  visit.     PAST MEDICAL HISTORY: Past Medical History:  Diagnosis Date  . ALLERGIC RHINITIS 08/20/2007   Qualifier: Diagnosis of  By: Hassell Done FNP, Tori Milks    . Anxiety   . Borderline diabetes   . Chest pain 05/08/2013   Normal coronary arteriography in November 2017  . Chronic low back pain 01/25/2007   Qualifier: Diagnosis of  By: Beryle Lathe    . Depression   . Gastroesophageal reflux disease without esophagitis 09/29/2016  . Heart disease    afib  . High risk medication use 11/02/2016   Overview:  Multaq  . Hypertension   . HYPERTENSION, BENIGN SYSTEMIC 01/25/2007   Qualifier: Diagnosis of  By: Beryle Lathe    . Hypertensive heart disease without heart failure 09/29/2016  . Ingrown toenail 10/21/2012  . IUD 2007  . Lumbago with sciatica of left side   . Migraine 07/22/2011  . Morbid obesity ([place])   . Obesity 01/25/2007   Qualifier: Diagnosis of  By: Beryle Lathe    . Paroxysmal atrial fibrillation ([place]) 09/29/2016  . PRESENCE OF IUD 11/28/2004   Qualifier: Diagnosis of  By: Hassell Done FNP, Tori Milks    . Reactive depression 01/25/2007   Qualifier: Diagnosis of  By: Beryle Lathe    . Right knee meniscal tear 12/15/2011  . Sciatica 01/25/2007   Qualifier: Diagnosis of  By: Beryle Lathe      PAST SURGICAL HISTORY: Past Surgical History:  Procedure Laterality Date  . CARDIAC CATHETERIZATION  09/29/2016  . CESAREAN SECTION  1989  . CHOLECYSTECTOMY  2010  . KNEE SURGERY     Bilateral.  . TUBAL LIGATION  1989    FAMILY HISTORY: Family History  Problem Relation Age of Onset  . Depression Mother   . Hypertension Mother   . Stroke Father   . Hypertension Father   . Melanoma Father   . Cancer Other   . Hypertension Other   . Stroke Other   . Heart disease Other   . Diabetes Other   . Depression Sister   . Alcohol abuse Brother     SOCIAL HISTORY: Social History   Socioeconomic History  . Marital status: Married    Spouse name: Not on file  . Number  of children: Not on file  . Years of education: Not on file  . Highest education level: Not on file  Occupational History  . Not on file  Social Needs  . Financial resource strain: Not on file  . Food insecurity:    Worry: Not on file    Inability: Not on file  . Transportation needs:    Medical: Not on file    Non-medical: Not on file  Tobacco Use  . Smoking status: Former Research scientist (life sciences)  . Smokeless tobacco: Never Used  Substance and Sexual Activity  . Alcohol use: Yes    Comment: Occasional.  . Drug use: No  . Sexual activity: Not Currently    Partners: Male    Birth control/protection: IUD  Lifestyle  . Physical activity:    Days per week: Not  on file    Minutes per session: Not on file  . Stress: Not on file  Relationships  . Social connections:    Talks on phone: Not on file    Gets together: Not on file    Attends religious service: Not on file    Active member of club or organization: Not on file    Attends meetings of clubs or organizations: Not on file    Relationship status: Not on file  . Intimate partner violence:    Fear of current or ex partner: Not on file    Emotionally abused: Not on file    Physically abused: Not on file    Forced sexual activity: Not on file  Other Topics Concern  . Not on file  Social History Narrative  . Not on file     PHYSICAL EXAM  There were no vitals filed for this visit. There is no height or weight on file to calculate BMI.  Generalized: Well developed, morbidly obese female in no acute distress  Head: normocephalic and atraumatic,. Oropharynx benign  Neck: Supple,  Musculoskeletal: No deformity   Neurological examination   Mentation: Alert oriented to time, place, history taking. Attention span and concentration appropriate. Recent and remote memory intact.  Follows all commands speech and language fluent.   Cranial nerve II-XII: Pupils were equal round reactive to light extraocular movements were full, visual field  were full on confrontational test. Facial sensation and strength were normal. hearing was intact to finger rubbing bilaterally. Uvula tongue midline. head turning and shoulder shrug were normal and symmetric.Tongue protrusion into cheek strength was normal. Motor: normal bulk and tone, full strength in the BUE, BLE,  Sensory: normal and symmetric to light touch,  Coordination: finger-nose-finger, heel-to-shin bilaterally, no dysmetria Gait and Station: Rising up from seated position without assistance, normal stance, no difficulty with turns no assistive device   DIAGNOSTIC DATA (LABS, IMAGING, TESTING) - I reviewed patient records, labs, notes, testing and imaging myself where available.  Lab Results  Component Value Date   WBC 7.1 05/08/2013   HGB 13.0 05/08/2013   HCT 39.9 05/08/2013   MCV 83.0 05/08/2013   PLT 314 05/08/2013      Component Value Date/Time   NA 138 05/08/2013 1636   K 4.2 05/08/2013 1636   CL 97 05/08/2013 1636   CO2 32 05/08/2013 1636   GLUCOSE 89 05/08/2013 1636   BUN 11 05/08/2013 1636   CREATININE 0.79 05/08/2013 1636   CALCIUM 9.7 05/08/2013 1636   PROT 7.1 05/08/2013 1636   ALBUMIN 4.1 05/08/2013 1636   AST 16 05/08/2013 1636   ALT 23 05/08/2013 1636   ALKPHOS 53 05/08/2013 1636   BILITOT 0.3 05/08/2013 1636   GFRNONAA >60 12/21/2008 2317   GFRAA  12/21/2008 2317    >60        The eGFR has been calculated using the MDRD equation. This calculation has not been validated in all clinical situations. eGFR's persistently <60 mL/min signify possible Chronic Kidney Disease.     ASSESSMENT AND PLAN  52 y.o. year old female  has a past medical history of Morbid obesity ([place]), and new lead diagnosed obstructive sleep apnea here for initial CPAP compliance.Data dated 01/13/2018-02/11/2018 shows compliance greater than 4 hours at 87%.  Average usage 7 hours 19 minutes.  Pressure 5-15 cm.  EPR level 3.  AHI 1.2 leaks 95th percentile 5.7.   CPAP  compliance greater than 4 hours 87%  Continue same settings  Follow-up in 6 months Dennie Bible, [facility], [facility], APRN  [facility] [address], [place], [place] [address] [phone]

## 2018-08-15 ENCOUNTER — Ambulatory Visit: Payer: BLUE CROSS/BLUE SHIELD | Admitting: Nurse Practitioner

## 2018-08-16 ENCOUNTER — Encounter: Payer: Self-pay | Admitting: Nurse Practitioner

## 2019-06-01 DIAGNOSIS — S76312A Strain of muscle, fascia and tendon of the posterior muscle group at thigh level, left thigh, initial encounter: Secondary | ICD-10-CM

## 2019-06-01 HISTORY — DX: Strain of muscle, fascia and tendon of the posterior muscle group at thigh level, left thigh, initial encounter: S76.312A

## 2019-06-10 DIAGNOSIS — S8392XA Sprain of unspecified site of left knee, initial encounter: Secondary | ICD-10-CM | POA: Insufficient documentation

## 2019-06-20 DIAGNOSIS — M2352 Chronic instability of knee, left knee: Secondary | ICD-10-CM | POA: Insufficient documentation

## 2019-06-20 HISTORY — DX: Chronic instability of knee, left knee: M23.52

## 2019-07-14 DIAGNOSIS — S83242A Other tear of medial meniscus, current injury, left knee, initial encounter: Secondary | ICD-10-CM

## 2019-07-14 HISTORY — DX: Other tear of medial meniscus, current injury, left knee, initial encounter: S83.242A

## 2019-07-16 DIAGNOSIS — Z13220 Encounter for screening for lipoid disorders: Secondary | ICD-10-CM

## 2019-07-16 HISTORY — DX: Encounter for screening for lipoid disorders: Z13.220

## 2019-09-27 DIAGNOSIS — Z4789 Encounter for other orthopedic aftercare: Secondary | ICD-10-CM | POA: Insufficient documentation

## 2019-09-27 HISTORY — DX: Encounter for other orthopedic aftercare: Z47.89

## 2020-10-14 DIAGNOSIS — J028 Acute pharyngitis due to other specified organisms: Secondary | ICD-10-CM

## 2020-10-14 HISTORY — DX: Acute pharyngitis due to other specified organisms: J02.8

## 2021-02-12 ENCOUNTER — Encounter: Payer: Self-pay | Admitting: *Deleted

## 2021-02-12 ENCOUNTER — Encounter: Payer: Self-pay | Admitting: Cardiology

## 2021-02-12 DIAGNOSIS — R002 Palpitations: Secondary | ICD-10-CM

## 2021-02-12 DIAGNOSIS — R0600 Dyspnea, unspecified: Secondary | ICD-10-CM

## 2021-02-12 HISTORY — DX: Dyspnea, unspecified: R06.00

## 2021-02-12 HISTORY — DX: Palpitations: R00.2

## 2021-02-26 DIAGNOSIS — I519 Heart disease, unspecified: Secondary | ICD-10-CM | POA: Insufficient documentation

## 2021-03-01 ENCOUNTER — Other Ambulatory Visit: Payer: Self-pay

## 2021-03-01 ENCOUNTER — Ambulatory Visit (INDEPENDENT_AMBULATORY_CARE_PROVIDER_SITE_OTHER): Payer: 59 | Admitting: Cardiology

## 2021-03-01 ENCOUNTER — Encounter: Payer: Self-pay | Admitting: Cardiology

## 2021-03-01 VITALS — BP 104/78 | HR 68 | Ht 63.0 in | Wt 363.0 lb

## 2021-03-01 DIAGNOSIS — I480 Paroxysmal atrial fibrillation: Secondary | ICD-10-CM

## 2021-03-01 DIAGNOSIS — I10 Essential (primary) hypertension: Secondary | ICD-10-CM

## 2021-03-01 DIAGNOSIS — G4733 Obstructive sleep apnea (adult) (pediatric): Secondary | ICD-10-CM | POA: Diagnosis not present

## 2021-03-01 MED ORDER — LISINOPRIL-HYDROCHLOROTHIAZIDE 20-12.5 MG PO TABS: 1.0000 | ORAL_TABLET | Freq: Every day | ORAL | 3 refills | Status: DC

## 2021-03-01 NOTE — Progress Notes (Signed)
Cardiology Office Note:    Date:  03/01/2021   ID:  Nancy Wu, DOB [birth year], MRN [id number]  PCP:  Noni Saupe, MD  Cardiologist:  No primary care provider on file.  Electrophysiologist:  None   Referring MD: Julianne Handler, NP    History of Present Illness:    Nancy Wu is a 55 y.o. female with a hx of morbid obesity, paroxysmal atrial fibrillation on Eliquis in [place], central sleep apnea she is on CPAP however has not had this checked since she gained significant amount of weight recently, anxiety and depression is here today to reestablish cardiac care.  Of note the patient did see Dr. Dulce Sellar for years ago but was forced to change cardiologist after her insurance changes and she followed with [facility].  Now that she has a different insurance she decides to come back to heart care.  Per care everywhere her heart catheterization in November 2017 was normal without any evidence of coronary artery disease.  Today she does not have any complaints but tells me that she is here to reestablish care.  The only thing she is interested in is hopefully getting off Multaq to another antiarrhythmic given the cost.  She denies any chest pain, lightheadedness or dizziness.   Past Medical History:  Diagnosis Date  . Acute pharyngitis due to other specified organisms 10/14/2020  . ALLERGIC RHINITIS 08/20/2007   Qualifier: Diagnosis of  By: Daphine Deutscher FNP, Zena Amos    . Anxiety   . Borderline diabetes   . Chest pain 05/08/2013   Normal coronary arteriography in November 2017  . Chronic anticoagulation 09/26/2017  . Chronic low back pain 01/25/2007   Qualifier: Diagnosis of  By: Bradly Bienenstock    . Depression   . Dyspnea 02/12/2021  . Essential hypertension 01/25/2007   Qualifier: Diagnosis of  By: Bradly Bienenstock    . Gastroesophageal reflux disease without esophagitis 09/29/2016  . Heart disease    afib  . High risk medication use 11/02/2016   Overview:  Multaq   . HYPERTENSION, BENIGN SYSTEMIC 01/25/2007   Qualifier: Diagnosis of  By: Bradly Bienenstock    . Hypertensive heart disease without heart failure 09/29/2016  . Ingrown toenail 10/21/2012  . IUD 2007  . Lumbago with sciatica of left side   . Migraine 07/22/2011  . Morbid obesity (HCC)   . 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HCC) 11/14/2017  . Obstructive sleep apnea treated with continuous positive airway pressure (CPAP) 02/12/2018  . Palpitations 02/12/2021  . Paroxysmal atrial fibrillation (HCC) 09/29/2016  . PRESENCE OF IUD 11/28/2004   Qualifier: Diagnosis of  By: Daphine Deutscher FNP, Zena Amos    . Reactive depression 01/25/2007   Qualifier: Diagnosis of  By: Bradly Bienenstock    . 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 Right knee meniscal tear 12/15/2011  . Sciatica 01/25/2007   Qualifier: Diagnosis of  By: Bradly Bienenstock    . Strain of left hamstring 06/01/2019    Past Surgical History:  Procedure Laterality Date  . CARDIAC CATHETERIZATION Bilateral 09/29/2016   [place]  . CESAREAN SECTION  1989  . CHOLECYSTECTOMY  2010  . KNEE SURGERY Bilateral   . TUBAL LIGATION  1989    Current Medications: No outpatient medications have been marked as taking for the 03/01/21 encounter (Office Visit) with Thomasene Ripple, DO.     Allergies:   Codeine   Social History   Socioeconomic History  .  Marital status: Married    Spouse name: Not on file  . Number of children: Not on file  . Years of education: Not on file  . Highest education level: Not on file  Occupational History  . Not on file  Tobacco Use  . Smoking status: Former Games developer  . Smokeless tobacco: Never Used  Substance and Sexual Activity  . Alcohol use: Yes    Comment: Occasional.  . Drug use: No  . Sexual activity: Not Currently    Partners: Male    Birth control/protection: I.U.D.  Other Topics Concern  . Not on file  Social History Narrative  . Not on file   Social  Determinants of Health   Financial Resource Strain: Not on file  Food Insecurity: Not on file  Transportation Needs: Not on file  Physical Activity: Not on file  Stress: Not on file  Social Connections: Not on file     Family History: The patient's family history includes Alcohol abuse in her brother; Cancer in an other family member; Depression in her mother and sister; Diabetes in an other family member; Heart disease in an other family member; Hypertension in her father, mother, and another family member; Lymphoma in her father; Melanoma in her father; Stroke in her father and another family member.  ROS:   Review of Systems  Constitution: Negative for decreased appetite, fever and weight gain.  HENT: Negative for congestion, ear discharge, hoarse voice and sore throat.   Eyes: Negative for discharge, redness, vision loss in right eye and visual halos.  Cardiovascular: Negative for chest pain, dyspnea on exertion, leg swelling, orthopnea and palpitations.  Respiratory: Negative for cough, hemoptysis, shortness of breath and snoring.   Endocrine: Negative for heat intolerance and polyphagia.  Hematologic/Lymphatic: Negative for bleeding problem. Does not bruise/bleed easily.  Skin: Negative for flushing, nail changes, rash and suspicious lesions.  Musculoskeletal: Negative for arthritis, joint pain, muscle cramps, myalgias, neck pain and stiffness.  Gastrointestinal: Negative for abdominal pain, bowel incontinence, diarrhea and excessive appetite.  Genitourinary: Negative for decreased libido, genital sores and incomplete emptying.  Neurological: Negative for brief paralysis, focal weakness, headaches and loss of balance.  Psychiatric/Behavioral: Negative for altered mental status, depression and suicidal ideas.  Allergic/Immunologic: Negative for HIV exposure and persistent infections.    EKGs/Labs/Other Studies Reviewed:    The following studies were reviewed today:   EKG:   The ekg ordered today demonstrates atrial fibrillation with last two beat sinus with rapid ventricular rate, left anterior fascicular block.  Poor R wave progression.  Left heart catheterization which was done in November 2017 Selective Coronaries  Complications: No Complications.  Conclusions  Diagnostic Procedure Summary  1. Normal Coronaries  2. Normal LV function  Diagnostic Procedure Recommendations  Will start Anticoag with A fib.  Multaq started at [facility], will continue.  I have reviewed the recent history and physical documentation. I personally  spent 15 minutes continuously monitoring the patient during the administration  of moderate sedation. Pre and post activities have been reviewed. I was  present for the entire procedure.  Signatures  Electronically signed by Holley Raring, MD(Diagnostic Physician) on  09/29/2016 15:27  Angiographic findings  Cardiac Arteries and Lesion Findings  LMCA: 0%.  LAD: 0%.  LCx: 0%.  RCA: 0%.  Ramus: 0%.  Procedure Data  Procedure Date  Date: 09/29/2016 Start: 14:51  Contrast Material    Recent Labs: No results found for requested labs within last 8760 hours.  Recent Lipid Panel  Component Value Date/Time   CHOL 155 04/22/2011 0900   TRIG 112 04/22/2011 0900   HDL 45 04/22/2011 0900   CHOLHDL 3.4 04/22/2011 0900   VLDL 22 04/22/2011 0900   LDLCALC 88 04/22/2011 0900   LDLDIRECT 80 05/08/2013 1636    Physical Exam:    VS:  BP 104/78 (BP Location: Left Arm)   Pulse 68   Ht 5\' 3"  (1.6 m)   Wt (!) 363 lb (164.7 kg)   SpO2 97%   BMI 64.30 kg/m     Wt Readings from Last 3 Encounters:  03/01/21 (!) 363 lb (164.7 kg)  01/20/21 (!) 369 lb (167.4 kg)  02/12/18 (!) 337 lb 9.6 oz (153.1 kg)     GEN: Well nourished, well developed in no acute distress HEENT: Normal NECK: No JVD; No carotid bruits LYMPHATICS: No lymphadenopathy CARDIAC: S1S2 noted,RRR, no murmurs, rubs, gallops RESPIRATORY:  Clear to auscultation without  rales, wheezing or rhonchi  ABDOMEN: Soft, non-tender, non-distended, +bowel sounds, no guarding. EXTREMITIES: No edema, No cyanosis, no clubbing MUSCULOSKELETAL:  No deformity  SKIN: Warm and dry NEUROLOGIC:  Alert and oriented x 3, non-focal PSYCHIATRIC:  Normal affect, good insight  ASSESSMENT:    1. Paroxysmal atrial fibrillation (HCC)   2. OSA (obstructive sleep apnea)   3. Morbid obesity (HCC)   4. Essential hypertension    PLAN:     She is currently on Multaq which she tells me is not affordable for her anymore.  We will plan to transition the patient off of Multaq she will use the medication she has until she is done.  We will repeat an echocardiogram to reassess her LV function.  Her recent heart catheterization in 2017 showed no evidence of coronary artery disease.  If her EF remains normal will use flecainide in this patient to help.  She also has obstructive sleep apnea which she has been using her CPAP but has gained significant amount of weight since she was fitted for CPAP.  I think she needs to be reassessed given her weight change per   I also encouraged patient to see our medical weight management program.  She will benefit greatly from this.    Her blood pressure is acceptable continue patient on current antihypertensive medication regimen.   Medication Adjustments/Labs and Tests Ordered: Current medicines are reviewed at length with the patient today.  Concerns regarding medicines are outlined above.  Orders Placed This Encounter  Procedures  . Amb Ref to Medical Weight Management  . EKG 12-Lead  . ECHOCARDIOGRAM COMPLETE   Meds ordered this encounter  Medications  . lisinopril-hydrochlorothiazide (ZESTORETIC) 20-12.5 MG tablet    Sig: Take 1 tablet by mouth daily.    Dispense:  90 tablet    Refill:  3    Patient Instructions   Medication Instructions:  Your physician recommends that you continue on your current medications as directed. Please refer  to the Current Medication list given to you today. *If you need a refill on your cardiac medications before your next appointment, please call your pharmacy*   Lab Work: None If you have labs (blood work) drawn today and your tests are completely normal, you will receive your results only by: 2018 MyChart Message (if you have MyChart) OR . A paper copy in the mail If you have any lab test that is abnormal or we need to change your treatment, we will call you to review the results.   Testing/Procedures: Your physician has requested that you  have an echocardiogram. Echocardiography is a painless test that uses sound waves to create images of your heart. It provides your doctor with information about the size and shape of your heart and how well your heart's chambers and valves are working. This procedure takes approximately one hour. There are no restrictions for this procedure.    Follow-Up: At [facility], you and your health needs are our priority.  As part of our continuing mission to provide you with exceptional heart care, we have created designated Provider Care Teams.  These Care Teams include your primary Cardiologist (physician) and Advanced Practice Providers (APPs -  Physician Assistants and Nurse Practitioners) who all work together to provide you with the care you need, when you need it.  We recommend signing up for the patient portal called "MyChart".  Sign up information is provided on this After Visit Summary.  MyChart is used to connect with patients for Virtual Visits (Telemedicine).  Patients are able to view lab/test results, encounter notes, upcoming appointments, etc.  Non-urgent messages can be sent to your provider as well.   To learn more about what you can do with MyChart, go to [email or web address].    Your next appointment:   4 week(s)  The format for your next appointment:   In Person  Provider:   Thomasene Ripple, DO   Other  Instructions  Echocardiogram An echocardiogram is a test that uses sound waves (ultrasound) to produce images of the heart. Images from an echocardiogram can provide important information about:  Heart size and shape.  The size and thickness and movement of your heart's walls.  Heart muscle function and strength.  Heart valve function or if you have stenosis. Stenosis is when the heart valves are too narrow.  If blood is flowing backward through the heart valves (regurgitation).  A tumor or infectious growth around the heart valves.  Areas of heart muscle that are not working well because of poor blood flow or injury from a heart attack.  Aneurysm detection. An aneurysm is a weak or damaged part of an artery wall. The wall bulges out from the normal force of blood pumping through the body. Tell a health care provider about:  Any allergies you have.  All medicines you are taking, including vitamins, herbs, eye drops, creams, and over-the-counter medicines.  Any blood disorders you have.  Any surgeries you have had.  Any medical conditions you have.  Whether you are pregnant or may be pregnant. What are the risks? Generally, this is a safe test. However, problems may occur, including an allergic reaction to dye (contrast) that may be used during the test. What happens before the test? No specific preparation is needed. You may eat and drink normally. What happens during the test?  You will take off your clothes from the waist up and put on a hospital gown.  Electrodes or electrocardiogram (ECG)patches may be placed on your chest. The electrodes or patches are then connected to a device that monitors your heart rate and rhythm.  You will lie down on a table for an ultrasound exam. A gel will be applied to your chest to help sound waves pass through your skin.  A handheld device, called a transducer, will be pressed against your chest and moved over your heart. The  transducer produces sound waves that travel to your heart and bounce back (or "echo" back) to the transducer. These sound waves will be captured in real-time and changed into images of your heart  that can be viewed on a video monitor. The images will be recorded on a computer and reviewed by your health care provider.  You may be asked to change positions or hold your breath for a short time. This makes it easier to get different views or better views of your heart.  In some cases, you may receive contrast through an IV in one of your veins. This can improve the quality of the pictures from your heart. The procedure may vary among health care providers and hospitals.   What can I expect after the test? You may return to your normal, everyday life, including diet, activities, and medicines, unless your health care provider tells you not to do that. Follow these instructions at home:  It is up to you to get the results of your test. Ask your health care provider, or the department that is doing the test, when your results will be ready.  Keep all follow-up visits. This is important. Summary  An echocardiogram is a test that uses sound waves (ultrasound) to produce images of the heart.  Images from an echocardiogram can provide important information about the size and shape of your heart, heart muscle function, heart valve function, and other possible heart problems.  You do not need to do anything to prepare before this test. You may eat and drink normally.  After the echocardiogram is completed, you may return to your normal, everyday life, unless your health care provider tells you not to do that. This information is not intended to replace advice given to you by your health care provider. Make sure you discuss any questions you have with your health care provider. Document Revised: 07/07/2020 Document Reviewed: 07/07/2020 Elsevier Patient Education  2021 Elsevier Inc.      Adopting a  Healthy Lifestyle.  Know what a healthy weight is for you (roughly BMI <25) and aim to maintain this   Aim for 7+ servings of fruits and vegetables daily   65-80+ fluid ounces of water or unsweet tea for healthy kidneys   Limit to max 1 drink of alcohol per day; avoid smoking/tobacco   Limit animal fats in diet for cholesterol and heart health - choose grass fed whenever available   Avoid highly processed foods, and foods high in saturated/trans fats   Aim for low stress - take time to unwind and care for your mental health   Aim for 150 min of moderate intensity exercise weekly for heart health, and weights twice weekly for bone health   Aim for 7-9 hours of sleep daily   When it comes to diets, agreement about the perfect plan isnt easy to find, even among the experts. Experts at the [facility] [employer] developed an idea known as the Healthy Eating Plate. Just imagine a plate divided into logical, healthy portions.   The emphasis is on diet quality:   Load up on vegetables and fruits - one-half of your plate: Aim for color and variety, and remember that potatoes dont count.   Go for whole grains - one-quarter of your plate: Whole wheat, barley, wheat berries, quinoa, oats, brown rice, and foods made with them. If you want pasta, go with whole wheat pasta.   Protein power - one-quarter of your plate: Fish, chicken, beans, and nuts are all healthy, versatile protein sources. Limit red meat.   The diet, however, does go beyond the plate, offering a few other suggestions.   Use healthy plant oils, such as olive,  canola, soy, corn, sunflower and peanut. Check the labels, and avoid partially hydrogenated oil, which have unhealthy trans fats.   If youre thirsty, drink water. Coffee and tea are good in moderation, but skip sugary drinks and limit milk and dairy products to one or two daily servings.   The type of carbohydrate in the diet is more important than the  amount. Some sources of carbohydrates, such as vegetables, fruits, whole grains, and beans-are healthier than others.   Finally, stay active  Signed, Thomasene RippleKardie Kimmora Risenhoover, DO  03/01/2021 2:39 PM    [facility]  The patient is in agreement with the above plan. The patient left the office in stable condition.  The patient will follow up in   Medication Adjustments/Labs and Tests Ordered: Current medicines are reviewed at length with the patient today.  Concerns regarding medicines are outlined above.  Orders Placed This Encounter  Procedures  . Amb Ref to Medical Weight Management  . EKG 12-Lead  . ECHOCARDIOGRAM COMPLETE   Meds ordered this encounter  Medications  . lisinopril-hydrochlorothiazide (ZESTORETIC) 20-12.5 MG tablet    Sig: Take 1 tablet by mouth daily.    Dispense:  90 tablet    Refill:  3    Patient Instructions   Medication Instructions:  Your physician recommends that you continue on your current medications as directed. Please refer to the Current Medication list given to you today. *If you need a refill on your cardiac medications before your next appointment, please call your pharmacy*   Lab Work: None If you have labs (blood work) drawn today and your tests are completely normal, you will receive your results only by: Marland Kitchen. MyChart Message (if you have MyChart) OR . A paper copy in the mail If you have any lab test that is abnormal or we need to change your treatment, we will call you to review the results.   Testing/Procedures: Your physician has requested that you have an echocardiogram. Echocardiography is a painless test that uses sound waves to create images of your heart. It provides your doctor with information about the size and shape of your heart and how well your heart's chambers and valves are working. This procedure takes approximately one hour. There are no restrictions for this procedure.    Follow-Up: At [facility], you  and your health needs are our priority.  As part of our continuing mission to provide you with exceptional heart care, we have created designated Provider Care Teams.  These Care Teams include your primary Cardiologist (physician) and Advanced Practice Providers (APPs -  Physician Assistants and Nurse Practitioners) who all work together to provide you with the care you need, when you need it.  We recommend signing up for the patient portal called "MyChart".  Sign up information is provided on this After Visit Summary.  MyChart is used to connect with patients for Virtual Visits (Telemedicine).  Patients are able to view lab/test results, encounter notes, upcoming appointments, etc.  Non-urgent messages can be sent to your provider as well.   To learn more about what you can do with MyChart, go to [email or web address].    Your next appointment:   4 week(s)  The format for your next appointment:   In Person  Provider:   Thomasene RippleKardie Paris Hohn, DO   Other Instructions  Echocardiogram An echocardiogram is a test that uses sound waves (ultrasound) to produce images of the heart. Images from an echocardiogram can provide important information about:  Heart size  and shape.  The size and thickness and movement of your heart's walls.  Heart muscle function and strength.  Heart valve function or if you have stenosis. Stenosis is when the heart valves are too narrow.  If blood is flowing backward through the heart valves (regurgitation).  A tumor or infectious growth around the heart valves.  Areas of heart muscle that are not working well because of poor blood flow or injury from a heart attack.  Aneurysm detection. An aneurysm is a weak or damaged part of an artery wall. The wall bulges out from the normal force of blood pumping through the body. Tell a health care provider about:  Any allergies you have.  All medicines you are taking, including vitamins, herbs, eye drops, creams, and  over-the-counter medicines.  Any blood disorders you have.  Any surgeries you have had.  Any medical conditions you have.  Whether you are pregnant or may be pregnant. What are the risks? Generally, this is a safe test. However, problems may occur, including an allergic reaction to dye (contrast) that may be used during the test. What happens before the test? No specific preparation is needed. You may eat and drink normally. What happens during the test?  You will take off your clothes from the waist up and put on a hospital gown.  Electrodes or electrocardiogram (ECG)patches may be placed on your chest. The electrodes or patches are then connected to a device that monitors your heart rate and rhythm.  You will lie down on a table for an ultrasound exam. A gel will be applied to your chest to help sound waves pass through your skin.  A handheld device, called a transducer, will be pressed against your chest and moved over your heart. The transducer produces sound waves that travel to your heart and bounce back (or "echo" back) to the transducer. These sound waves will be captured in real-time and changed into images of your heart that can be viewed on a video monitor. The images will be recorded on a computer and reviewed by your health care provider.  You may be asked to change positions or hold your breath for a short time. This makes it easier to get different views or better views of your heart.  In some cases, you may receive contrast through an IV in one of your veins. This can improve the quality of the pictures from your heart. The procedure may vary among health care providers and hospitals.   What can I expect after the test? You may return to your normal, everyday life, including diet, activities, and medicines, unless your health care provider tells you not to do that. Follow these instructions at home:  It is up to you to get the results of your test. Ask your health care  provider, or the department that is doing the test, when your results will be ready.  Keep all follow-up visits. This is important. Summary  An echocardiogram is a test that uses sound waves (ultrasound) to produce images of the heart.  Images from an echocardiogram can provide important information about the size and shape of your heart, heart muscle function, heart valve function, and other possible heart problems.  You do not need to do anything to prepare before this test. You may eat and drink normally.  After the echocardiogram is completed, you may return to your normal, everyday life, unless your health care provider tells you not to do that. This information is not intended  to replace advice given to you by your health care provider. Make sure you discuss any questions you have with your health care provider. Document Revised: 07/07/2020 Document Reviewed: 07/07/2020 Elsevier Patient Education  2021 [employer]      Adopting a Healthy Lifestyle.  Know what a healthy weight is for you (roughly BMI <25) and aim to maintain this   Aim for 7+ servings of fruits and vegetables daily   65-80+ fluid ounces of water or unsweet tea for healthy kidneys   Limit to max 1 drink of alcohol per day; avoid smoking/tobacco   Limit animal fats in diet for cholesterol and heart health - choose grass fed whenever available   Avoid highly processed foods, and foods high in saturated/trans fats   Aim for low stress - take time to unwind and care for your mental health   Aim for 150 min of moderate intensity exercise weekly for heart health, and weights twice weekly for bone health   Aim for 7-9 hours of sleep daily   When it comes to diets, agreement about the perfect plan isnt easy to find, even among the experts. Experts at the [facility] [employer] developed an idea known as the Healthy Eating Plate. Just imagine a plate divided into logical, healthy portions.   The  emphasis is on diet quality:   Load up on vegetables and fruits - one-half of your plate: Aim for color and variety, and remember that potatoes dont count.   Go for whole grains - one-quarter of your plate: Whole wheat, barley, wheat berries, quinoa, oats, brown rice, and foods made with them. If you want pasta, go with whole wheat pasta.   Protein power - one-quarter of your plate: Fish, chicken, beans, and nuts are all healthy, versatile protein sources. Limit red meat.   The diet, however, does go beyond the plate, offering a few other suggestions.   Use healthy plant oils, such as olive, canola, soy, corn, sunflower and peanut. Check the labels, and avoid partially hydrogenated oil, which have unhealthy trans fats.   If youre thirsty, drink water. Coffee and tea are good in moderation, but skip sugary drinks and limit milk and dairy products to one or two daily servings.   The type of carbohydrate in the diet is more important than the amount. Some sources of carbohydrates, such as vegetables, fruits, whole grains, and beans-are healthier than others.   Finally, stay active  Signed, Thomasene Ripple, DO  03/01/2021 2:39 PM    [facility]

## 2021-03-01 NOTE — Patient Instructions (Signed)
Medication Instructions:  Your physician recommends that you continue on your current medications as directed. Please refer to the Current Medication list given to you today. *If you need a refill on your cardiac medications before your next appointment, please call your pharmacy*   Lab Work: None If you have labs (blood work) drawn today and your tests are completely normal, you will receive your results only by: Marland Kitchen MyChart Message (if you have MyChart) OR . A paper copy in the mail If you have any lab test that is abnormal or we need to change your treatment, we will call you to review the results.   Testing/Procedures: Your physician has requested that you have an echocardiogram. Echocardiography is a painless test that uses sound waves to create images of your heart. It provides your doctor with information about the size and shape of your heart and how well your heart's chambers and valves are working. This procedure takes approximately one hour. There are no restrictions for this procedure.    Follow-Up: At [facility], you and your health needs are our priority.  As part of our continuing mission to provide you with exceptional heart care, we have created designated Provider Care Teams.  These Care Teams include your primary Cardiologist (physician) and Advanced Practice Providers (APPs -  Physician Assistants and Nurse Practitioners) who all work together to provide you with the care you need, when you need it.  We recommend signing up for the patient portal called "MyChart".  Sign up information is provided on this After Visit Summary.  MyChart is used to connect with patients for Virtual Visits (Telemedicine).  Patients are able to view lab/test results, encounter notes, upcoming appointments, etc.  Non-urgent messages can be sent to your provider as well.   To learn more about what you can do with MyChart, go to [email or web address].    Your next appointment:   4  week(s)  The format for your next appointment:   In Person  Provider:   Thomasene Ripple, DO   Other Instructions  Echocardiogram An echocardiogram is a test that uses sound waves (ultrasound) to produce images of the heart. Images from an echocardiogram can provide important information about:  Heart size and shape.  The size and thickness and movement of your heart's walls.  Heart muscle function and strength.  Heart valve function or if you have stenosis. Stenosis is when the heart valves are too narrow.  If blood is flowing backward through the heart valves (regurgitation).  A tumor or infectious growth around the heart valves.  Areas of heart muscle that are not working well because of poor blood flow or injury from a heart attack.  Aneurysm detection. An aneurysm is a weak or damaged part of an artery wall. The wall bulges out from the normal force of blood pumping through the body. Tell a health care provider about:  Any allergies you have.  All medicines you are taking, including vitamins, herbs, eye drops, creams, and over-the-counter medicines.  Any blood disorders you have.  Any surgeries you have had.  Any medical conditions you have.  Whether you are pregnant or may be pregnant. What are the risks? Generally, this is a safe test. However, problems may occur, including an allergic reaction to dye (contrast) that may be used during the test. What happens before the test? No specific preparation is needed. You may eat and drink normally. What happens during the test?  You will take off your clothes from the  waist up and put on a hospital gown.  Electrodes or electrocardiogram (ECG)patches may be placed on your chest. The electrodes or patches are then connected to a device that monitors your heart rate and rhythm.  You will lie down on a table for an ultrasound exam. A gel will be applied to your chest to help sound waves pass through your skin.  A handheld  device, called a transducer, will be pressed against your chest and moved over your heart. The transducer produces sound waves that travel to your heart and bounce back (or "echo" back) to the transducer. These sound waves will be captured in real-time and changed into images of your heart that can be viewed on a video monitor. The images will be recorded on a computer and reviewed by your health care provider.  You may be asked to change positions or hold your breath for a short time. This makes it easier to get different views or better views of your heart.  In some cases, you may receive contrast through an IV in one of your veins. This can improve the quality of the pictures from your heart. The procedure may vary among health care providers and hospitals.   What can I expect after the test? You may return to your normal, everyday life, including diet, activities, and medicines, unless your health care provider tells you not to do that. Follow these instructions at home:  It is up to you to get the results of your test. Ask your health care provider, or the department that is doing the test, when your results will be ready.  Keep all follow-up visits. This is important. Summary  An echocardiogram is a test that uses sound waves (ultrasound) to produce images of the heart.  Images from an echocardiogram can provide important information about the size and shape of your heart, heart muscle function, heart valve function, and other possible heart problems.  You do not need to do anything to prepare before this test. You may eat and drink normally.  After the echocardiogram is completed, you may return to your normal, everyday life, unless your health care provider tells you not to do that. This information is not intended to replace advice given to you by your health care provider. Make sure you discuss any questions you have with your health care provider. Document Revised: 07/07/2020  Document Reviewed: 07/07/2020 Elsevier Patient Education  2021 [employer].

## 2021-03-02 ENCOUNTER — Ambulatory Visit (INDEPENDENT_AMBULATORY_CARE_PROVIDER_SITE_OTHER): Payer: 59

## 2021-03-02 DIAGNOSIS — G4733 Obstructive sleep apnea (adult) (pediatric): Secondary | ICD-10-CM | POA: Diagnosis not present

## 2021-03-02 DIAGNOSIS — I480 Paroxysmal atrial fibrillation: Secondary | ICD-10-CM | POA: Diagnosis not present

## 2021-03-02 LAB — ECHOCARDIOGRAM COMPLETE
Area-P 1/2: 5.84 cm2
S' Lateral: 3.45 cm

## 2021-03-02 NOTE — Progress Notes (Signed)
Complete echocardiogram performed.  Jimmy Nehan Flaum RDCS, RVT  

## 2021-03-04 ENCOUNTER — Telehealth: Payer: Self-pay

## 2021-03-04 NOTE — Telephone Encounter (Signed)
-----   Message from Thomasene Ripple, DO sent at 03/03/2021  9:09 AM EDT ----- Overall good study.  Her EF is normal.  When I see you at your next visit I can explain any further details to you.

## 2021-03-04 NOTE — Telephone Encounter (Signed)
Spoke with patient regarding results and recommendation.  Patient verbalizes understanding and is agreeable to plan of care. Advised patient to call back with any issues or concerns.  

## 2021-03-29 ENCOUNTER — Encounter: Payer: Self-pay | Admitting: Cardiology

## 2021-03-29 ENCOUNTER — Other Ambulatory Visit: Payer: Self-pay

## 2021-03-29 ENCOUNTER — Ambulatory Visit (INDEPENDENT_AMBULATORY_CARE_PROVIDER_SITE_OTHER): Payer: 59 | Admitting: Cardiology

## 2021-03-29 ENCOUNTER — Ambulatory Visit (INDEPENDENT_AMBULATORY_CARE_PROVIDER_SITE_OTHER): Payer: 59

## 2021-03-29 VITALS — BP 136/80 | HR 70 | Ht 63.0 in | Wt 364.6 lb

## 2021-03-29 DIAGNOSIS — I480 Paroxysmal atrial fibrillation: Secondary | ICD-10-CM

## 2021-03-29 DIAGNOSIS — I119 Hypertensive heart disease without heart failure: Secondary | ICD-10-CM | POA: Diagnosis not present

## 2021-03-29 DIAGNOSIS — I10 Essential (primary) hypertension: Secondary | ICD-10-CM

## 2021-03-29 DIAGNOSIS — Z6841 Body Mass Index (BMI) 40.0 and over, adult: Secondary | ICD-10-CM

## 2021-03-29 DIAGNOSIS — Z7901 Long term (current) use of anticoagulants: Secondary | ICD-10-CM

## 2021-03-29 DIAGNOSIS — I471 Supraventricular tachycardia: Secondary | ICD-10-CM

## 2021-03-29 MED ORDER — METOPROLOL SUCCINATE ER 25 MG PO TB24: 12.5000 mg | ORAL_TABLET | Freq: Every day | ORAL | 3 refills | Status: DC

## 2021-03-29 MED ORDER — FLECAINIDE ACETATE 50 MG PO TABS: 50.0000 mg | ORAL_TABLET | Freq: Two times a day (BID) | ORAL | 3 refills | Status: DC

## 2021-03-29 NOTE — Patient Instructions (Addendum)
Medication Instructions:  Your physician has recommended you make the following change in your medication: START: Flecainide 25 mg twice daily START: Toprol -XL 12.5 mg (half a tablet) once daily *If you need a refill on your cardiac medications before your next appointment, please call your pharmacy*   Lab Work: None If you have labs (blood work) drawn today and your tests are completely normal, you will receive your results only by: Marland Kitchen MyChart Message (if you have MyChart) OR . A paper copy in the mail If you have any lab test that is abnormal or we need to change your treatment, we will call you to review the results.   Testing/Procedures: A zio monitor was ordered today. It will remain on for 14 days. You will then return monitor and event diary in provided box. It takes 1-2 weeks for report to be downloaded and returned to [place]. We will call you with the results. If monitor falls off or has orange flashing light, please call Zio for further instructions.     Follow-Up: At [facility], you and your health needs are our priority.  As part of our continuing mission to provide you with exceptional heart care, we have created designated Provider Care Teams.  These Care Teams include your primary Cardiologist (physician) and Advanced Practice Providers (APPs -  Physician Assistants and Nurse Practitioners) who all work together to provide you with the care you need, when you need it.  We recommend signing up for the patient portal called "MyChart".  Sign up information is provided on this After Visit Summary.  MyChart is used to connect with patients for Virtual Visits (Telemedicine).  Patients are able to view lab/test results, encounter notes, upcoming appointments, etc.  Non-urgent messages can be sent to your provider as well.   To learn more about what you can do with MyChart, go to [email or web address].    Your next appointment:   1 month(s)  The format for your next appointment:    In Person  Provider:   Thomasene Ripple, DO   Other Instructions Come back in 1 week to have an EKG completed.   ZIO  WHY IS MY DOCTOR PRESCRIBING ZIO? The Zio system is proven and trusted by physicians to detect and diagnose irregular heart rhythms -- and has been prescribed to hundreds of thousands of patients.  The FDA has cleared the Zio system to monitor for many different kinds of irregular heart rhythms. In a study, physicians were able to reach a diagnosis 90% of the time with the Zio system1.  You can wear the Zio monitor -- a small, discreet, comfortable patch -- during your normal day-to-day activity, including while you sleep, shower, and exercise, while it records every single heartbeat for analysis.  1Barrett, P., et al. Comparison of 24 Hour Holter Monitoring Versus 14 Day Novel Adhesive Patch Electrocardiographic Monitoring. American Journal of Medicine, 2014.  ZIO VS. HOLTER MONITORING The Zio monitor can be comfortably worn for up to 14 days. Holter monitors can be worn for 24 to 48 hours, limiting the time to record any irregular heart rhythms you may have. Zio is able to capture data for the 51% of patients who have their first symptom-triggered arrhythmia after 48 hours.1  LIVE WITHOUT RESTRICTIONS The Zio ambulatory cardiac monitor is a small, unobtrusive, and water-resistant patch--you might even forget you're wearing it. The Zio monitor records and stores every beat of your heart, whether you're sleeping, working out, or showering. Remove on: May 9th, 2022

## 2021-03-29 NOTE — Progress Notes (Signed)
Cardiology Office Note:    Date:  03/29/2021   ID:  Nancy Reiningamela Zimmermann, DOB 05/20/1966, MRN [id number]  PCP:  Noni Saupeedding, John F. II, MD  Cardiologist:  No primary care provider on file.  Electrophysiologist:  None   Referring MD: Noni Saupeedding, John F. II, MD     History of Present Illness:    Nancy Wu is a 55 y.o. female with  a hx of morbid obesity, paroxysmal atrial fibrillation on Eliquis in [place], central sleep apnea she is on CPAP however has not had this checked since she gained significant amount of weight recently, anxiety and depression is here today to reestablish cardiac care.  Of note the patient did see Dr. Dulce SellarMunley for years ago but was forced to change cardiologist after her insurance changes and [facility]  Now that she has a different insurance she decides to come back to heart care.  Per care everywhere her heart catheterization in November 2017 was normal without any evidence.  I saw the patient March 01, 2021 she presented to establish cardiac care.  During her visit she had expressed interest in transitioning off of Multaq because she cannot care for it.  We discussed get an echocardiogram if normal EF since recent cath did not show any coronary artery disease we would proceed with transitioning to flecainide from Multaq.  She is here today for follow-up visit.  This is on the patient she has since stopped the Multaq as insurance company was not covering it.  She denies any chest pain or shortness of breath.  She was able to get her echocardiogram.  Past Medical History:  Diagnosis Date  . Acute pharyngitis due to other specified organisms 10/14/2020  . ALLERGIC RHINITIS 08/20/2007   Qualifier: Diagnosis of  By: Daphine DeutscherMartin FNP, Zena AmosNykedtra    . Anxiety   . Borderline diabetes   . Chest pain 05/08/2013   Normal coronary arteriography in November 2017  . Chronic anticoagulation 09/26/2017  . Chronic low back pain 01/25/2007   Qualifier:  Diagnosis of  By: Bradly BienenstockHarrelson, Kathy    . Depression   . Dyspnea 02/12/2021  . Essential hypertension 01/25/2007   Qualifier: Diagnosis of  By: Bradly BienenstockHarrelson, Kathy    . Gastroesophageal reflux disease without esophagitis 09/29/2016  . Heart disease    afib  . High risk medication use 11/02/2016   Overview:  Multaq  . HYPERTENSION, BENIGN SYSTEMIC 01/25/2007   Qualifier: Diagnosis of  By: Bradly BienenstockHarrelson, Kathy    . Hypertensive heart disease without heart failure 09/29/2016  . Ingrown toenail 10/21/2012  . IUD 2007  . Lumbago with sciatica of left side   . Migraine 07/22/2011  . Morbid obesity (HCC)   . 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HCC) 11/14/2017  . Obstructive sleep apnea treated with continuous positive airway pressure (CPAP) 02/12/2018  . Palpitations 02/12/2021  . Paroxysmal atrial fibrillation (HCC) 09/29/2016  . PRESENCE OF IUD 11/28/2004   Qualifier: Diagnosis of  By: Daphine DeutscherMartin FNP, Zena AmosNykedtra    . Reactive depression 01/25/2007   Qualifier: Diagnosis of  By: Bradly BienenstockHarrelson, Kathy    . 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 Right knee meniscal tear 12/15/2011  . Sciatica 01/25/2007   Qualifier: Diagnosis of  By: Bradly BienenstockHarrelson, Kathy    . Strain of left hamstring 06/01/2019    Past Surgical History:  Procedure Laterality Date  . CARDIAC CATHETERIZATION Bilateral 09/29/2016   [place]  . CESAREAN SECTION  1989  . CHOLECYSTECTOMY  2010  . KNEE SURGERY Bilateral   . TUBAL LIGATION  1989    Current Medications: Current Meds  Medication Sig  . apixaban (ELIQUIS) 5 MG TABS tablet Take 1 tablet (5 mg total) by mouth 2 (two) times daily.  Marland Kitchen buPROPion (WELLBUTRIN XL) 150 MG 24 hr tablet Take 150 mg by mouth 3 (three) times daily.  Marland Kitchen gabapentin (NEURONTIN) 800 MG tablet Take 800 mg by mouth 3 (three) times daily.  Marland Kitchen lisinopril-hydrochlorothiazide (ZESTORETIC) 20-12.5 MG tablet Take 1 tablet by mouth daily.  . Multiple Vitamins-Minerals (ONE-A-DAY 50  PLUS) TABS Take 1 tablet by mouth daily.  . pantoprazole (PROTONIX) 40 MG tablet Take 1 tablet by mouth daily.  . [DISCONTINUED] MULTAQ 400 MG tablet Take 1 tablet (400 mg total) by mouth 2 (two) times daily.     Allergies:   Codeine   Social History   Socioeconomic History  . Marital status: Married    Spouse name: Not on file  . Number of children: Not on file  . Years of education: Not on file  . Highest education level: Not on file  Occupational History  . Not on file  Tobacco Use  . Smoking status: Former Games developer  . Smokeless tobacco: Never Used  Substance and Sexual Activity  . Alcohol use: Yes    Comment: Occasional.  . Drug use: No  . Sexual activity: Not Currently    Partners: Male    Birth control/protection: I.U.D.  Other Topics Concern  . Not on file  Social History Narrative  . Not on file   Social Determinants of Health   Financial Resource Strain: Not on file  Food Insecurity: Not on file  Transportation Needs: Not on file  Physical Activity: Not on file  Stress: Not on file  Social Connections: Not on file     Family History: The patient's family history includes Alcohol abuse in her brother; Cancer in an other family member; Depression in her mother and sister; Diabetes in an other family member; Heart disease in an other family member; Hypertension in her father, mother, and another family member; Lymphoma in her father; Melanoma in her father; Stroke in her father and another family member.  ROS:   Review of Systems  Constitution: Negative for decreased appetite, fever and weight gain.  HENT: Negative for congestion, ear discharge, hoarse voice and sore throat.   Eyes: Negative for discharge, redness, vision loss in right eye and visual halos.  Cardiovascular: Negative for chest pain, dyspnea on exertion, leg swelling, orthopnea and palpitations.  Respiratory: Negative for cough, hemoptysis, shortness of breath and snoring.   Endocrine: Negative  for heat intolerance and polyphagia.  Hematologic/Lymphatic: Negative for bleeding problem. Does not bruise/bleed easily.  Skin: Negative for flushing, nail changes, rash and suspicious lesions.  Musculoskeletal: Negative for arthritis, joint pain, muscle cramps, myalgias, neck pain and stiffness.  Gastrointestinal: Negative for abdominal pain, bowel incontinence, diarrhea and excessive appetite.  Genitourinary: Negative for decreased libido, genital sores and incomplete emptying.  Neurological: Negative for brief paralysis, focal weakness, headaches and loss of balance.  Psychiatric/Behavioral: Negative for altered mental status, depression and suicidal ideas.  Allergic/Immunologic: Negative for HIV exposure and persistent infections.    EKGs/Labs/Other Studies Reviewed:    The following studies were reviewed today:   EKG: None today Transthoracic echocardiogram March 02, 2021 IMPRESSIONS    1. Left ventricular ejection fraction, by estimation, is 55 to 60%. The  left ventricle has normal function.  The left ventricle has no regional  wall motion abnormalities. Left ventricular diastolic parameters are  indeterminate. The average left  ventricular global longitudinal strain is -10.2 %. The global longitudinal  strain is abnormal.  2. Right ventricular systolic function is normal. The right ventricular  size is normal.  3. The mitral valve is normal in structure. Trivial mitral valve  regurgitation. No evidence of mitral stenosis.  4. The aortic valve is normal in structure. Aortic valve regurgitation is  not visualized. No aortic stenosis is present.  5. The inferior vena cava is normal in size with greater than 50%  respiratory variability, suggesting right atrial pressure of 3 mmHg.   FINDINGS  Left Ventricle: Left ventricular ejection fraction, by estimation, is 55  to 60%. The left ventricle has normal function. The left ventricle has no  regional wall motion  abnormalities. The average left ventricular global  longitudinal strain is -10.2 %.  The global longitudinal strain is abnormal. The left ventricular internal  cavity size was normal in size. There is no left ventricular hypertrophy.  Left ventricular diastolic parameters are indeterminate.   Right Ventricle: The right ventricular size is normal. No increase in  right ventricular wall thickness. Right ventricular systolic function is  normal.   Left Atrium: Left atrial size was normal in size.   Right Atrium: Right atrial size was normal in size.   Pericardium: There is no evidence of pericardial effusion.   Mitral Valve: The mitral valve is normal in structure. Trivial mitral  valve regurgitation. No evidence of mitral valve stenosis.   Tricuspid Valve: The tricuspid valve is normal in structure. Tricuspid  valve regurgitation is not demonstrated. No evidence of tricuspid  stenosis.   Aortic Valve: The aortic valve is normal in structure. Aortic valve  regurgitation is not visualized. No aortic stenosis is present.   Pulmonic Valve: The pulmonic valve was normal in structure. Pulmonic valve  regurgitation is not visualized. No evidence of pulmonic stenosis.   Aorta: The aortic root is normal in size and structure.   Venous: The inferior vena cava is normal in size with greater than 50%  respiratory variability, suggesting right atrial pressure of 3 mmHg.   IAS/Shunts: No atrial level shunt detected by color flow Doppler.   Left heart catheterization which was done in November 2017 Selective Coronaries  Complications: No Complications.  Conclusions  Diagnostic Procedure Summary  1. Normal Coronaries  2. Normal LV function  Diagnostic Procedure Recommendations  Will start Anticoag with A fib.  Multaq started at [facility], will continue.  I have reviewed the recent history and physical documentation. I personally  spent 15 minutes continuously monitoring the patient during the  administration  of moderate sedation. Pre and post activities have been reviewed. I was  present for the entire procedure.  Signatures  Electronically signed by Holley Raring, MD(Diagnostic Physician) on  09/29/2016 15:27  Angiographic findings  Cardiac Arteries and Lesion Findings  LMCA: 0%.  LAD: 0%.  LCx: 0%.  RCA: 0%.  Ramus: 0%.  Procedure Data  Procedure Date  Date: 09/29/2016 Start: 14:51  Contrast Material   Recent Labs: No results found for requested labs within last 8760 hours.  Recent Lipid Panel    Component Value Date/Time   CHOL 155 04/22/2011 0900   TRIG 112 04/22/2011 0900   HDL 45 04/22/2011 0900   CHOLHDL 3.4 04/22/2011 0900   VLDL 22 04/22/2011 0900   LDLCALC 88 04/22/2011 0900   LDLDIRECT 80 05/08/2013 1636  Physical Exam:    VS:  BP 136/80   Pulse 70   Ht  (1.6 m)   Wt (!) 364 lb 9.6 oz (165.4 kg)   SpO2 98%   BMI 64.59 kg/m     Wt Readings from Last 3 Encounters:  03/29/21 (!) 364 lb 9.6 oz (165.4 kg)  03/01/21 (!) 363 lb (164.7 kg)  01/20/21 (!) 369 lb (167.4 kg)     GEN: Well nourished, well developed in no acute distress HEENT: Normal NECK: No JVD; No carotid bruits LYMPHATICS: No lymphadenopathy CARDIAC: S1S2 noted,RRR, no murmurs, rubs, gallops RESPIRATORY:  Clear to auscultation without rales, wheezing or rhonchi  ABDOMEN: Soft, non-tender, non-distended, +bowel sounds, no guarding. EXTREMITIES: No edema, No cyanosis, no clubbing MUSCULOSKELETAL:  No deformity  SKIN: Warm and dry NEUROLOGIC:  Alert and oriented x 3, non-focal PSYCHIATRIC:  Normal affect, good insight  ASSESSMENT:    1. Essential hypertension   2. Hypertensive heart disease without heart failure   3. Paroxysmal atrial fibrillation (HCC)   4. Morbid obesity with body mass index (BMI) of 50.0 to 59.9 in adult (HCC)   5. Chronic anticoagulation    PLAN:     She has stopped Multaq would not like to do is place the patient on flecainide 50 mg twice  a day with low-dose beta-blocker.  She does tell me that with her paroxysmal fibrillation she feels short of breath and also significant palpitations when she is in A. fib.  We are going to place a monitor on the patient for 7 days to see if flecainide is going to keep her in sinus rhythm.  In addition I think she is a great candidate to be evaluated by my EP colleague to determine need of ablation or continuing antiarrhythmic.  She is agreeable with this.  Continue patient on her Eliquis 5 mg twice a day.  We discussed her echo report no questions at this time.  The patient understands the need to lose weight with diet and exercise. We have discussed specific strategies for this.  She has not heard back from the healthy weight and weight loss program.  We will go to refer her.  Come back in 1 week for EKG for flecainide.  We will follow-up with me in 3 months.  The patient is in agreement with the above plan. The patient left the office in stable condition.  The patient will follow up in 3 months.   Medication Adjustments/Labs and Tests Ordered: Current medicines are reviewed at length with the patient today.  Concerns regarding medicines are outlined above.  No orders of the defined types were placed in this encounter.  No orders of the defined types were placed in this encounter.   There are no Patient Instructions on file for this visit.   Adopting a Healthy Lifestyle.  Know what a healthy weight is for you (roughly BMI <25) and aim to maintain this   Aim for 7+ servings of fruits and vegetables daily   65-80+ fluid ounces of water or unsweet tea for healthy kidneys   Limit to max 1 drink of alcohol per day; avoid smoking/tobacco   Limit animal fats in diet for cholesterol and heart health - choose grass fed whenever available   Avoid highly processed foods, and foods high in saturated/trans fats   Aim for low stress - take time to unwind and care for your mental health   Aim  for 150 min of moderate intensity exercise weekly  for heart health, and weights twice weekly for bone health   Aim for 7-9 hours of sleep daily   When it comes to diets, agreement about the perfect plan isnt easy to find, even among the experts. Experts at the [facility] [employer] developed an idea known as the Healthy Eating Plate. Just imagine a plate divided into logical, healthy portions.   The emphasis is on diet quality:   Load up on vegetables and fruits - one-half of your plate: Aim for color and variety, and remember that potatoes dont count.   Go for whole grains - one-quarter of your plate: Whole wheat, barley, wheat berries, quinoa, oats, brown rice, and foods made with them. If you want pasta, go with whole wheat pasta.   Protein power - one-quarter of your plate: Fish, chicken, beans, and nuts are all healthy, versatile protein sources. Limit red meat.   The diet, however, does go beyond the plate, offering a few other suggestions.   Use healthy plant oils, such as olive, canola, soy, corn, sunflower and peanut. Check the labels, and avoid partially hydrogenated oil, which have unhealthy trans fats.   If youre thirsty, drink water. Coffee and tea are good in moderation, but skip sugary drinks and limit milk and dairy products to one or two daily servings.   The type of carbohydrate in the diet is more important than the amount. Some sources of carbohydrates, such as vegetables, fruits, whole grains, and beans-are healthier than others.   Finally, stay active  Signed, Thomasene Ripple, DO  03/29/2021 2:41 PM    [facility]

## 2021-03-29 NOTE — Addendum Note (Signed)
Addended by: Reynolds Bowl on: 03/29/2021 02:58 PM   Modules accepted: Orders

## 2021-03-30 ENCOUNTER — Telehealth: Payer: Self-pay | Admitting: Cardiology

## 2021-03-30 NOTE — Telephone Encounter (Signed)
Pt c/o medication issue:  1. Name of Medication:  flecainide (TAMBOCOR) 50 MG tablet  2. How are you currently taking this medication (dosage and times per day)?  Patient has not began taking this medication  3. Are you having a reaction (difficulty breathing--STAT)? No   4. What is your medication issue?   Flecainide was prescribed during the patient's appointment yesterday. She states her pcp informed her that this medication may cause other cardiac-related issues because it does not mix with her anti-depressant, Bupropion.

## 2021-04-05 ENCOUNTER — Ambulatory Visit: Payer: 59

## 2021-04-29 ENCOUNTER — Ambulatory Visit: Payer: 59 | Admitting: Cardiology

## 2021-05-24 ENCOUNTER — Ambulatory Visit (INDEPENDENT_AMBULATORY_CARE_PROVIDER_SITE_OTHER): Payer: 59 | Admitting: Cardiology

## 2021-05-24 ENCOUNTER — Encounter: Payer: Self-pay | Admitting: Cardiology

## 2021-05-24 ENCOUNTER — Other Ambulatory Visit: Payer: Self-pay

## 2021-05-24 VITALS — BP 116/78 | HR 64 | Ht 63.0 in | Wt 358.0 lb

## 2021-05-24 DIAGNOSIS — I480 Paroxysmal atrial fibrillation: Secondary | ICD-10-CM | POA: Diagnosis not present

## 2021-05-24 DIAGNOSIS — I10 Essential (primary) hypertension: Secondary | ICD-10-CM

## 2021-05-24 DIAGNOSIS — Z6841 Body Mass Index (BMI) 40.0 and over, adult: Secondary | ICD-10-CM

## 2021-05-24 DIAGNOSIS — I119 Hypertensive heart disease without heart failure: Secondary | ICD-10-CM

## 2021-05-24 NOTE — Patient Instructions (Signed)
Medication Instructions:  ?Your physician recommends that you continue on your current medications as directed. Please refer to the Current Medication list given to you today. ? ?*If you need a refill on your cardiac medications before your next appointment, please call your pharmacy* ? ? ?Lab Work: ?NONE ?If you have labs (blood work) drawn today and your tests are completely normal, you will receive your results only by: ?MyChart Message (if you have MyChart) OR ?A paper copy in the mail ?If you have any lab test that is abnormal or we need to change your treatment, we will call you to review the results. ? ? ?Testing/Procedures: ?NONE ? ? ?Follow-Up: ?At [facility], you and your health needs are our priority.  As part of our continuing mission to provide you with exceptional heart care, we have created designated Provider Care Teams.  These Care Teams include your primary Cardiologist (physician) and Advanced Practice Providers (APPs -  Physician Assistants and Nurse Practitioners) who all work together to provide you with the care you need, when you need it. ? ?We recommend signing up for the patient portal called "MyChart".  Sign up information is provided on this After Visit Summary.  MyChart is used to connect with patients for Virtual Visits (Telemedicine).  Patients are able to view lab/test results, encounter notes, upcoming appointments, etc.  Non-urgent messages can be sent to your provider as well.   ?To learn more about what you can do with MyChart, go to [email or web address].   ? ?Your next appointment:   ?1 year(s) ? ?The format for your next appointment:   ?In Person ? ?Provider:   ?Brian Munley, MD  ? ? ?Other Instructions ?  ?

## 2021-05-24 NOTE — Progress Notes (Signed)
Cardiology Office Note:    Date:  05/24/2021   ID:  Nancy Reiningamela Budde, DOB 04/13/1966, MRN [id number]  PCP:  Noni Saupeedding, John F. II, MD  Cardiologist:  None  Electrophysiologist:  None   Referring MD: Noni Saupeedding, John F. II, MD   No chief complaint on file. I am doing well  History of Present Illness:    Nancy Wu is a 55 y.o. female with a hx of of morbid obesity, paroxysmal atrial fibrillation on Eliquis i and flecainide, central sleep apnea she is on CPAP however has not had this checked since she gained significant amount of weight recently, anxiety and depression is here today to reestablish cardiac care.   Of note the patient did see Dr. Dulce SellarMunley for years ago but was forced to change cardiologist after her insurance changes and she [facility].  Now that she has a different insurance she decides to come back to heart care.   Per care everywhere her heart catheterization in November 2017 was normal without any evidence.   I saw the patient March 01, 2021 she presented to establish cardiac care.  During her visit she had expressed interest in transitioning off of Multaq because she cannot care for it.  We discussed get an echocardiogram if normal EF since recent cath did not show any coronary artery disease we would proceed with transitioning to flecainide from Multaq.  I saw the patient on Mar 29, 2020 at that time she was concerned as she was going in and out of atrial fibrillation.  Placed a monitor on the patient for 7 days which came back with no evidence of A. fib at that time.  She is here today for follow-up visit.  She has been doing well from a cardiovascular standpoint.     Past Medical History:  Diagnosis Date   Acute pharyngitis due to other specified organisms 10/14/2020   ALLERGIC RHINITIS 08/20/2007   Qualifier: Diagnosis of  By: Daphine DeutscherMartin FNP, Nykedtra     Anxiety    Borderline diabetes    Chest pain 05/08/2013   Normal coronary arteriography  in November 2017   Chronic anticoagulation 09/26/2017   Chronic low back pain 01/25/2007   Qualifier: Diagnosis of  By: Bradly BienenstockHarrelson, Kathy     Depression    Dyspnea 02/12/2021   Essential hypertension 01/25/2007   Qualifier: Diagnosis of  By: Bradly BienenstockHarrelson, Kathy     Gastroesophageal reflux disease without esophagitis 09/29/2016   Heart disease    afib   High risk medication use 11/02/2016   Overview:  Multaq   HYPERTENSION, BENIGN SYSTEMIC 01/25/2007   Qualifier: Diagnosis of  By: Bradly BienenstockHarrelson, Kathy     Hypertensive heart disease without heart failure 09/29/2016   Ingrown toenail 10/21/2012   IUD 2007   Lumbago with sciatica of left side    Migraine 07/22/2011   Morbid obesity (HCC)    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HCC) 11/14/2017   Obstructive sleep apnea treated with continuous positive airway pressure (CPAP) 02/12/2018   Palpitations 02/12/2021   Paroxysmal atrial fibrillation (HCC) 09/29/2016   PRESENCE OF IUD 11/28/2004   Qualifier: Diagnosis of  By: Daphine DeutscherMartin FNP, Nykedtra     Reactive depression 01/25/2007   Qualifier: Diagnosis of  By: Bradly BienenstockHarrelson, Kathy     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   escitalopram (LEXAPRO) 10 MG tablet Take 10 mg by mouth daily.   flecainide (TAMBOCOR) 50 MG tablet Take 1 tablet (50 mg total) by mouth 2 (two) times daily.   gabapentin (NEURONTIN) 800 MG tablet Take 800 mg by mouth 3 (three) times daily.    lisinopril-hydrochlorothiazide (ZESTORETIC) 20-12.5 MG tablet Take 1 tablet by mouth daily.   metoprolol succinate (TOPROL XL) 25 MG 24 hr tablet Take 0.5 tablets (12.5 mg total) by mouth daily.   Multiple Vitamins-Minerals (ONE-A-DAY 50 PLUS) TABS Take 1 tablet by mouth daily.   pantoprazole (PROTONIX) 40 MG tablet Take 1 tablet by mouth daily.   [DISCONTINUED] buPROPion (WELLBUTRIN XL) 150 MG 24 hr tablet Take 150 mg by mouth 3 (three) times daily.     Allergies:   Codeine   Social History   Socioeconomic History   Marital status: Married    Spouse name: Not on file   Number of children: Not on file   Years of education: Not on file   Highest education level: Not on file  Occupational History   Not on file  Tobacco Use   Smoking status: Former    Pack years: 0.00   Smokeless tobacco: Never  Substance and Sexual Activity   Alcohol use: Yes    Comment: Occasional.   Drug use: No   Sexual activity: Not Currently    Partners: Male    Birth control/protection: I.U.D.  Other Topics Concern   Not on file  Social History Narrative   Not on file   Social Determinants of Health   Financial Resource Strain: Not on file  Food Insecurity: Not on file  Transportation Needs: Not on file  Physical Activity: Not on file  Stress: Not on file  Social Connections: Not on file     Family History: The patient's family history includes Alcohol abuse in her brother; Cancer in an other family member; Depression in her mother and sister; Diabetes in an other family member; Heart disease in an other family member; Hypertension in her father, mother, and another family member; Lymphoma in her father; Melanoma in her father; Stroke in her father and another family member.  ROS:   Review of Systems  Constitution: Negative for decreased appetite, fever and weight gain.  HENT: Negative for congestion, ear discharge, hoarse voice and sore throat.   Eyes: Negative for discharge, redness,  vision loss in right eye and visual halos.  Cardiovascular: Negative for chest pain, dyspnea on exertion, leg swelling, orthopnea and palpitations.  Respiratory: Negative for cough, hemoptysis, shortness of breath and snoring.   Endocrine: Negative for heat intolerance and polyphagia.  Hematologic/Lymphatic: Negative for bleeding problem. Does not bruise/bleed easily.  Skin: Negative for flushing, nail changes, rash and suspicious lesions.  Musculoskeletal: Negative for arthritis, joint pain, muscle cramps, myalgias, neck pain and stiffness.  Gastrointestinal: Negative for abdominal pain, bowel incontinence, diarrhea and excessive appetite.  Genitourinary: Negative for decreased libido, genital sores and incomplete emptying.  Neurological: Negative for brief paralysis, focal weakness, headaches and loss of  balance.  Psychiatric/Behavioral: Negative for altered mental status, depression and suicidal ideas.  Allergic/Immunologic: Negative for HIV exposure and persistent infections.    EKGs/Labs/Other Studies Reviewed:    The following studies were reviewed today:   EKG: None today  ZIO monitor The patient wore the monitor for 7 days 4 hours starting Mar 29, 2021. Indication: Paroxysmal atrial fibrillation   The minimum heart rate was 54 bpm, maximum heart rate was 119 bpm, and average heart rate was 70 bpm. Predominant underlying rhythm was Sinus Rhythm.   3 supraventricular tachycardia runs occurred, the run with the fastest interval lasting 5 beats with a maximal heart rate of 119 bpm, the longest lasting 5 beats with an average rate of 94 bpm.     Premature atrial complexes were rare less than 1%. Premature Ventricular complexes rare less than 1%.       No pauses, No AV block and no atrial fibrillation present. Symptoms are associated with supraventricular tachycardia, sinus rhythm and premature atrial complexes.   Conclusion: This study is remarkable for rare symptomatic  supraventricular tachycard  Transthoracic echocardiogram March 02, 2021 IMPRESSIONS  1. Left ventricular ejection fraction, by estimation, is 55 to 60%. The left ventricle has normal function. The left ventricle has no regional  wall motion abnormalities. Left ventricular diastolic parameters are indeterminate. The average left  ventricular global longitudinal strain is -10.2 %. The global longitudinal strain is abnormal.   2. Right ventricular systolic function is normal. The right ventricular size is normal.   3. The mitral valve is normal in structure. Trivial mitral valve regurgitation. No evidence of mitral stenosis.   4. The aortic valve is normal in structure. Aortic valve regurgitation is not visualized. No aortic stenosis is present.   5. The inferior vena cava is normal in size with greater than 50% respiratory variability, suggesting right atrial pressure of 3 mmHg.   FINDINGS   Left Ventricle: Left ventricular ejection fraction, by estimation, is 55 to 60%. The left ventricle has normal function. The left ventricle has no regional wall motion abnormalities. The average left ventricular global longitudinal strain is -10.2 %.  The global longitudinal strain is abnormal. The left ventricular internal cavity size was normal in size. There is no left ventricular hypertrophy. Left ventricular diastolic parameters are indeterminate.   Right Ventricle: The right ventricular size is normal. No increase in  right ventricular wall thickness. Right ventricular systolic function is  normal.   Left Atrium: Left atrial size was normal in size.   Right Atrium: Right atrial size was normal in size.   Pericardium: There is no evidence of pericardial effusion.   Mitral Valve: The mitral valve is normal in structure. Trivial mitral  valve regurgitation. No evidence of mitral valve stenosis.   Tricuspid Valve: The tricuspid valve is normal in structure. Tricuspid  valve regurgitation is not  demonstrated. No evidence of tricuspid  stenosis.   Aortic Valve: The aortic valve is normal in structure. Aortic valve  regurgitation is not visualized. No aortic stenosis is present.   Pulmonic Valve: The pulmonic valve was normal in structure. Pulmonic valve  regurgitation is not visualized. No evidence of pulmonic stenosis.   Aorta: The aortic root is normal in size and structure.   Venous: The inferior vena cava is normal in size with greater than 50%  respiratory variability, suggesting right atrial pressure of 3 mmHg.   IAS/Shunts: No atrial level shunt detected by color flow Doppler.   Left heart catheterization which was  done in November 2017 Selective Coronaries  Complications: No Complications.  Conclusions  Diagnostic Procedure Summary  1. Normal Coronaries  2. Normal LV function  Diagnostic Procedure Recommendations  Will start Anticoag with A fib.  Multaq started at [facility], will continue.  I have reviewed the recent history and physical documentation. I personally  spent 15 minutes continuously monitoring the patient during the administration  of moderate sedation. Pre and post activities have been reviewed. I was  present for the entire procedure.   Signatures   Electronically signed by Holley Raring, MD(Diagnostic Physician) on   09/29/2016 15:27   Angiographic findings   Cardiac Arteries and Lesion Findings  LMCA: 0%.  LAD: 0%.  LCx: 0%.  RCA: 0%.  Ramus: 0%.  Procedure Data  Procedure Date  Date: 09/29/2016 Start: 14:51  Contrast Material    Recent Labs: No results found for requested labs within last 8760 hours.  Recent Lipid Panel    Component Value Date/Time   CHOL 155 04/22/2011 0900   TRIG 112 04/22/2011 0900   HDL 45 04/22/2011 0900   CHOLHDL 3.4 04/22/2011 0900   VLDL 22 04/22/2011 0900   LDLCALC 88 04/22/2011 0900   LDLDIRECT 80 05/08/2013 1636    Physical Exam:    VS:  BP 116/78   Pulse 64   Ht  (1.6 m)   Wt (!) 358 lb (162.4  kg)   SpO2 95%   BMI 63.42 kg/m     Wt Readings from Last 3 Encounters:  05/24/21 (!) 358 lb (162.4 kg)  03/29/21 (!) 364 lb 9.6 oz (165.4 kg)  03/01/21 (!) 363 lb (164.7 kg)     GEN: Well nourished, well developed in no acute distress HEENT: Normal NECK: No JVD; No carotid bruits LYMPHATICS: No lymphadenopathy CARDIAC: S1S2 noted,RRR, no murmurs, rubs, gallops RESPIRATORY:  Clear to auscultation without rales, wheezing or rhonchi  ABDOMEN: Soft, non-tender, non-distended, +bowel sounds, no guarding. EXTREMITIES: No edema, No cyanosis, no clubbing MUSCULOSKELETAL:  No deformity  SKIN: Warm and dry NEUROLOGIC:  Alert and oriented x 3, non-focal PSYCHIATRIC:  Normal affect, good insight  ASSESSMENT:    1. Paroxysmal atrial fibrillation (HCC)   2. Essential hypertension   3. Hypertensive heart disease without heart failure   4. Morbid obesity with body mass index (BMI) of 50.0 to 59.9 in adult [facility])    PLAN:     1.  We talked about her monitor we did not show any evidence of atrial fibrillation, she has been doing well on flecainide we will keep her on this medication regimen.  In addition with her Eliquis 5 mg twice daily. 2.  Blood pressure is acceptable, continue with current antihypertensive regimen. 3.  The patient understands the need to lose weight with diet and exercise. We have discussed specific strategies for this.  The patient is in agreement with the above plan. The patient left the office in stable condition.  The patient will follow up in 1 year with Dr. Dulce Sellar   Medication Adjustments/Labs and Tests Ordered: Current medicines are reviewed at length with the patient today.  Concerns regarding medicines are outlined above.  No orders of the defined types were placed in this encounter.  No orders of the defined types were placed in this encounter.   There are no Patient Instructions on file for this visit.   Adopting a Healthy Lifestyle.  Know what a  healthy weight is for you (roughly BMI <25) and aim to maintain this  Aim for 7+ servings of fruits and vegetables daily   65-80+ fluid ounces of water or unsweet tea for healthy kidneys   Limit to max 1 drink of alcohol per day; avoid smoking/tobacco   Limit animal fats in diet for cholesterol and heart health - choose grass fed whenever available   Avoid highly processed foods, and foods high in saturated/trans fats   Aim for low stress - take time to unwind and care for your mental health   Aim for 150 min of moderate intensity exercise weekly for heart health, and weights twice weekly for bone health   Aim for 7-9 hours of sleep daily   When it comes to diets, agreement about the perfect plan isnt easy to find, even among the experts. Experts at the [facility] [employer] developed an idea known as the Healthy Eating Plate. Just imagine a plate divided into logical, healthy portions.   The emphasis is on diet quality:   Load up on vegetables and fruits - one-half of your plate: Aim for color and variety, and remember that potatoes dont count.   Go for whole grains - one-quarter of your plate: Whole wheat, barley, wheat berries, quinoa, oats, brown rice, and foods made with them. If you want pasta, go with whole wheat pasta.   Protein power - one-quarter of your plate: Fish, chicken, beans, and nuts are all healthy, versatile protein sources. Limit red meat.   The diet, however, does go beyond the plate, offering a few other suggestions.   Use healthy plant oils, such as olive, canola, soy, corn, sunflower and peanut. Check the labels, and avoid partially hydrogenated oil, which have unhealthy trans fats.   If youre thirsty, drink water. Coffee and tea are good in moderation, but skip sugary drinks and limit milk and dairy products to one or two daily servings.   The type of carbohydrate in the diet is more important than the amount. Some sources of carbohydrates,  such as vegetables, fruits, whole grains, and beans-are healthier than others.   Finally, stay active  Signed, Thomasene Ripple, DO  05/24/2021 1:45 PM    [facility]

## 2021-06-15 ENCOUNTER — Other Ambulatory Visit: Payer: Self-pay | Admitting: Cardiology

## 2021-06-15 DIAGNOSIS — I480 Paroxysmal atrial fibrillation: Secondary | ICD-10-CM

## 2021-06-15 NOTE — Telephone Encounter (Signed)
Indication: Afib Age: 55 Weight: 162.4kg Crt: 1.0 01/20/21 Last office visit: 05/24/21 Tobb, DO Refill granted.

## 2021-07-25 NOTE — Progress Notes (Deleted)
Electrophysiology Office Note   Date:  07/25/2021   ID:  Nancy Wu, DOB 1966/04/06, MRN [id number]  PCP:  Noni Saupe, MD  Cardiologist:  Tobb Primary Electrophysiologist:  Royston Bekele Jorja Loa, MD    Chief Complaint: AF   History of Present Illness: Nancy Wu is a 55 y.o. female who is being seen today for the evaluation of AF at the request of Tobb, Kardie, DO. Presenting today for electrophysiology evaluation.  She has a true significant for morbid obesity, paroxysmal atrial fibrillation, sleep apnea, anxiety, and depression.  She was initially on Multaq, but had trouble affording the medication and has since been switched to flecainide.  She wore a cardiac monitor May 2021 that showed no evidence of atrial fibrillation.  Today, she denies*** symptoms of palpitations, chest pain, shortness of breath, orthopnea, PND, lower extremity edema, claudication, dizziness, presyncope, syncope, bleeding, or neurologic sequela. The patient is tolerating medications without difficulties.    Past Medical History:  Diagnosis Date   Acute pharyngitis due to other specified organisms 10/14/2020   ALLERGIC RHINITIS 08/20/2007   Qualifier: Diagnosis of  By: Daphine Deutscher FNP, Nykedtra     Anxiety    Borderline diabetes    Chest pain 05/08/2013   Normal coronary arteriography in November 2017   Chronic anticoagulation 09/26/2017   Chronic low back pain 01/25/2007   Qualifier: Diagnosis of  By: Bradly Bienenstock     Depression    Dyspnea 02/12/2021   Essential hypertension 01/25/2007   Qualifier: Diagnosis of  By: Bradly Bienenstock     Gastroesophageal reflux disease without esophagitis 09/29/2016   Heart disease    afib   High risk medication use 11/02/2016   Overview:  Multaq   HYPERTENSION, BENIGN SYSTEMIC 01/25/2007   Qualifier: Diagnosis of  By: Bradly Bienenstock     Hypertensive heart disease without heart failure 09/29/2016   Ingrown toenail 10/21/2012   IUD 2007   Lumbago  with sciatica of left side    Migraine 07/22/2011   Morbid obesity (HCC)    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HCC) 11/14/2017   Obstructive sleep apnea treated with continuous positive airway pressure (CPAP) 02/12/2018   Palpitations 02/12/2021   Paroxysmal atrial fibrillation (HCC) 09/29/2016   PRESENCE OF IUD 11/28/2004   Qualifier: Diagnosis of  By: Daphine Deutscher FNP, Nykedtra     Reactive depression 01/25/2007   Qualifier: Diagnosis of  By: Bradly Bienenstock     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     flecainide (TAMBOCOR) 50 MG tablet Take 1 tablet (50 mg total) by mouth 2 (two) times daily. 180 tablet 3   gabapentin (NEURONTIN) 800 MG tablet Take 800 mg by mouth 3 (three) times daily.     lisinopril-hydrochlorothiazide (ZESTORETIC) 20-12.5 MG tablet Take 1 tablet by  mouth daily. 90 tablet 3   metoprolol succinate (TOPROL XL) 25 MG 24 hr tablet Take 0.5 tablets (12.5 mg total) by mouth daily. 45 tablet 3   Multiple Vitamins-Minerals (ONE-A-DAY 50 PLUS) TABS Take 1 tablet by mouth daily.     pantoprazole (PROTONIX) 40 MG tablet Take 1 tablet by mouth daily.     No current facility-administered medications for this visit.    Allergies:   Codeine   Social History:  The patient  reports that she has quit smoking. She has never used smokeless tobacco. She reports current  alcohol use. She reports that she does not use drugs.   Family History:  The patient's family history includes Alcohol abuse in her brother; Cancer in an other family member; Depression in her mother and sister; Diabetes in an other family member; Heart disease in an other family member; Hypertension in her father, mother, and another family member; Lymphoma in her father; Melanoma in her father; Stroke in her father and another family member.    ROS:  Please see the history of present illness.   Otherwise, review of systems is positive for none.   All other systems are reviewed and negative.    PHYSICAL EXAM: VS:  There were no vitals taken for this visit. , BMI There is no height or weight on file to calculate BMI. GEN: Well nourished, well developed, in no acute distress  HEENT: normal  Neck: no JVD, carotid bruits, or masses Cardiac: ***RRR; no murmurs, rubs, or gallops,no edema  Respiratory:  clear to auscultation bilaterally, normal work of breathing GI: soft, nontender, nondistended, + BS MS: no deformity or atrophy  Skin: warm and dry Nuro:  Strength and sensation are intact Psych: euthymic mood, full affect  EKG:  EKG {ACTION; IS/IS ZOX:[id number]} ordered today. Personal review of the ekg ordered *** shows ***  Recent Labs: No results found for requested labs within last 8760 hours.    Lipid Panel     Component Value Date/Time   CHOL 155 04/22/2011 0900   TRIG 112 04/22/2011 0900   HDL 45 04/22/2011 0900   CHOLHDL 3.4 04/22/2011 0900   VLDL 22 04/22/2011 0900   LDLCALC 88 04/22/2011 0900   LDLDIRECT 80 05/08/2013 1636     Wt Readings from Last 3 Encounters:  05/24/21 (!) 358 lb (162.4 kg)  03/29/21 (!) 364 lb 9.6 oz (165.4 kg)  03/01/21 (!) 363 lb (164.7 kg)      Other studies Reviewed: Additional studies/ records that were reviewed today include: TTE 03/02/21  Review of the above records today demonstrates:   1. Left ventricular ejection fraction, by  estimation, is 55 to 60%. The  left ventricle has normal function. The left ventricle has no regional  wall motion abnormalities. Left ventricular diastolic parameters are  indeterminate. The average left  ventricular global longitudinal strain is -10.2 %. The global longitudinal  strain is abnormal.   2. Right ventricular systolic function is normal. The right ventricular  size is normal.   3. The mitral valve is normal in structure. Trivial mitral valve  regurgitation. No evidence of mitral stenosis.   4. The aortic valve is normal in structure. Aortic valve regurgitation is  not visualized. No aortic stenosis is present.   5. The inferior vena cava is normal in size with greater than 50%  respiratory variability, suggesting right atrial pressure of 3 mmHg.   Cardiac monitor 04/14/2021 personally reviewed This study is remarkable for rare symptomatic supraventricular tachycardia.  ASSESSMENT AND PLAN:  1.  Paroxysmal atrial fibrillation: Currently on Eliquis, flecainide, Toprol-XL.  CHA2DS2-VASc of 2.***  2.  Hypertension:***  3.  Morbid obesity:There is no height or weight on file to calculate BMI. ***  4.  Obstructive sleep apnea: CPAP compliance encouraged   Current medicines are reviewed at length with the patient today.   The patient {ACTIONS; HAS/DOES NOT HAVE:19233} concerns regarding her medicines.  The following changes were made today:  {NONE DEFAULTED:18576}  Labs/ tests ordered today include: *** No orders of the defined types were placed in this encounter.    Disposition:   FU with [employer] {gen number [id number]} {Days to years:10300}  Signed, Karsyn Jamie Jorja Loa, MD  07/25/2021 12:51 PM     [facility] [address] [place] [address] [phone] (office) [phone] (fax)

## 2021-07-26 ENCOUNTER — Institutional Professional Consult (permissible substitution): Payer: 59 | Admitting: Cardiology

## 2021-07-26 DIAGNOSIS — R079 Chest pain, unspecified: Secondary | ICD-10-CM

## 2021-09-06 ENCOUNTER — Ambulatory Visit (INDEPENDENT_AMBULATORY_CARE_PROVIDER_SITE_OTHER): Payer: 59 | Admitting: Cardiology

## 2021-09-06 ENCOUNTER — Encounter: Payer: Self-pay | Admitting: Cardiology

## 2021-09-06 ENCOUNTER — Other Ambulatory Visit: Payer: Self-pay

## 2021-09-06 ENCOUNTER — Other Ambulatory Visit: Payer: Self-pay | Admitting: Cardiology

## 2021-09-06 VITALS — BP 130/74 | HR 76 | Ht 63.0 in | Wt 362.4 lb

## 2021-09-06 DIAGNOSIS — G4730 Sleep apnea, unspecified: Secondary | ICD-10-CM

## 2021-09-06 DIAGNOSIS — I480 Paroxysmal atrial fibrillation: Secondary | ICD-10-CM

## 2021-09-06 MED ORDER — FLECAINIDE ACETATE 100 MG PO TABS: 100.0000 mg | ORAL_TABLET | Freq: Two times a day (BID) | ORAL | 3 refills | Status: DC

## 2021-09-06 NOTE — Progress Notes (Signed)
Electrophysiology Office Note   Date:  09/06/2021   ID:  Nancy Wu, DOB [birth year], MRN [id number]  PCP:  Noni Saupe, MD  Cardiologist:  Tobb Primary Electrophysiologist:  Loucinda Croy Jorja Loa, MD    Chief Complaint: AF   History of Present Illness: Nancy Wu is a 55 y.o. female who is being seen today for the evaluation of AF at the request of Tobb, Kardie, DO. Presenting today for electrophysiology evaluation.  She has a history significant for morbid obesity, atrial fibrillation, sleep apnea, hypertension, borderline diabetes.  She left heart catheterization November 2014 that showed coronary artery disease.    Today, she denies symptoms of palpitations, chest pain, shortness of breath, orthopnea, PND, lower extremity edema, claudication, dizziness, presyncope, syncope, bleeding, or neurologic sequela. The patient is tolerating medications without difficulties.  Unfortunately she is continue to have episodes of atrial fibrillation despite her flecainide.  She feels well today, but has episodes on a weekly basis that lasts for a few hours.  She was recently hospitalized at [facility] with atrial fibrillation and hypertension.  Her episode at that point lasted up to 10 hours.  She also gets chest discomfort when she is in atrial fibrillation.  There are no exacerbating or alleviating factors.  She would like to stay in normal rhythm.  She does have sleep apnea, but has not seen a sleep doctor in many years.   Past Medical History:  Diagnosis Date   Acute pharyngitis due to other specified organisms 10/14/2020   ALLERGIC RHINITIS 08/20/2007   Qualifier: Diagnosis of  By: Daphine Deutscher FNP, [place]     Anxiety    Borderline diabetes    Chest pain 05/08/2013   Normal coronary arteriography in November 2017   Chronic anticoagulation 09/26/2017   Chronic low back pain 01/25/2007   Qualifier: Diagnosis of  By: Bradly Bienenstock     Depression    Dyspnea 02/12/2021    Essential hypertension 01/25/2007   Qualifier: Diagnosis of  By: Bradly Bienenstock     Gastroesophageal reflux disease without esophagitis 09/29/2016   Heart disease    afib   High risk medication use 11/02/2016   Overview:  [place]   HYPERTENSION, BENIGN SYSTEMIC 01/25/2007   Qualifier: Diagnosis of  By: Bradly Bienenstock     Hypertensive heart disease without heart failure 09/29/2016   Ingrown toenail 10/21/2012   IUD 2007   Lumbago with sciatica of left side    Migraine 07/22/2011   Morbid obesity (HCC)    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HCC) 11/14/2017   Obstructive sleep apnea treated with continuous positive airway pressure (CPAP) 02/12/2018   Palpitations 02/12/2021   Paroxysmal atrial fibrillation (HCC) 09/29/2016   PRESENCE OF IUD 11/28/2004   Qualifier: Diagnosis of  By: Daphine Deutscher FNP, Nykedtra     Reactive depression 01/25/2007   Qualifier: Diagnosis of  By: Bradly Bienenstock     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     flecainide (TAMBOCOR) 100 MG tablet Take 1 tablet (100 mg total) by mouth 2 (two) times daily. 180 tablet 3   gabapentin (NEURONTIN) 800 MG tablet Take 800 mg by mouth 3 (three) times daily.     lisinopril-hydrochlorothiazide (ZESTORETIC) 20-12.5 MG tablet Take 1 tablet by mouth daily. 90 tablet 3   metoprolol succinate (TOPROL XL) 25 MG 24 hr  tablet Take 0.5 tablets (12.5 mg total) by mouth daily. 45 tablet 3   Multiple Vitamins-Minerals (ONE-A-DAY 50 PLUS) TABS Take 1 tablet by mouth daily.     pantoprazole (PROTONIX) 40 MG tablet Take 1 tablet by mouth daily.     No current facility-administered medications for this visit.    Allergies:   Codeine   Social History:  The patient  reports that she has quit smoking. She has never used smokeless tobacco. She reports current alcohol use. She reports that she does not use drugs.   Family History:  The patient's family history includes Alcohol abuse in her brother; Cancer in an other family member; Depression in her mother and sister; Diabetes in an other family member; Heart disease in an other family member; Hypertension in her father, mother, and another family member; Lymphoma in her father; Melanoma in her father; Stroke in her father and another family member.    ROS:  Please see the history of present illness.   Otherwise, review of systems is positive for none.   All other systems are reviewed and negative.    PHYSICAL EXAM: VS:  BP 130/74   Pulse 76   Ht 5\' 3"  (1.6 m)   Wt (!) 362 lb 6.4 oz (164.4 kg)   SpO2 96%   BMI 64.20 kg/m  , BMI Body mass index is 64.2 kg/m. GEN: Well nourished, well developed, in no acute distress  HEENT: normal  Neck: no JVD, carotid bruits, or masses Cardiac: RRR; no murmurs, rubs, or gallops,no edema  Respiratory:  clear to auscultation bilaterally, normal work of breathing GI: soft, nontender, nondistended, + BS MS: no deformity or atrophy  Skin: warm and dry Neuro:  Strength and sensation are intact Psych: euthymic mood, full affect  EKG:  EKG is ordered today. Personal review of the ekg ordered shows sinus rhythm, rate 76  Recent Labs: No results found for requested labs within last 8760 hours.    Lipid Panel     Component Value Date/Time   CHOL 155 04/22/2011 0900   TRIG 112 04/22/2011 0900   HDL 45 04/22/2011 0900    CHOLHDL 3.4 04/22/2011 0900   VLDL 22 04/22/2011 0900   LDLCALC 88 04/22/2011 0900   LDLDIRECT 80 05/08/2013 1636     Wt Readings from Last 3 Encounters:  09/06/21 (!) 362 lb 6.4 oz (164.4 kg)  05/24/21 (!) 358 lb (162.4 kg)  03/29/21 (!) 364 lb 9.6 oz (165.4 kg)      Other studies Reviewed: Additional studies/ records that were reviewed today include: TTE 03/02/21  Review of the above records today demonstrates:   1. Left ventricular ejection fraction, by estimation, is 55 to 60%. The  left ventricle has normal function. The left ventricle has no regional  wall motion abnormalities. Left ventricular diastolic parameters are  indeterminate. The average left  ventricular global longitudinal strain is -10.2 %. The global longitudinal  strain is  abnormal.   2. Right ventricular systolic function is normal. The right ventricular  size is normal.   3. The mitral valve is normal in structure. Trivial mitral valve  regurgitation. No evidence of mitral stenosis.   4. The aortic valve is normal in structure. Aortic valve regurgitation is  not visualized. No aortic stenosis is present.   5. The inferior vena cava is normal in size with greater than 50%  respiratory variability, suggesting right atrial pressure of 3 mmHg.   Cardiac monitor 04/14/2021 personally reviewed This study is remarkable for rare symptomatic supraventricular tachycardia.  ASSESSMENT AND PLAN:  1.  Paroxysmal atrial fibrillation:Currently on Eliquis 5 mg twice daily, flecainide 50 mg twice daily, Toprol-XL 25 mg daily.  CHA2DS2-VASc of 2.  She is unfortunately continued to have episodes of atrial fibrillation.  She has symptoms of weakness, fatigue and palpitations during these episodes.  We Alima Naser increase her flecainide to 100 mg twice daily.  She is on Lexapro.  She Malee Grays need an ECG in 2 weeks.  2.  Hypertension: Currently well controlled  3.  Morbid obesity: She Hendrick Pavich need to lose a significant amount of weight  prior to possibility of ablation.  She would like to lose weight.  We Latayna Ritchie give her phone numbers for the weight loss clinic at [facility].  I also discussed with the possibility of bariatric surgery.  She Adib Wahba think about this and get back to [place] with an answer.  4.  Obstructive sleep apnea: We Jeshawn Melucci refer to sleep clinic.  Case discussed with primary cardiology  Current medicines are reviewed at length with the patient today.   The patient does not have concerns regarding her medicines.  The following changes were made today:  none  Labs/ tests ordered today include:  Orders Placed This Encounter  Procedures   EKG 12-Lead      Disposition:   FU with Rock Sobol 3 months  Signed, Juliauna Stueve Jorja Loa, MD  09/06/2021 12:10 PM     [facility] [address] [place] [address] [phone] (office) [phone] (fax)

## 2021-09-06 NOTE — Patient Instructions (Addendum)
Medication Instructions:  Your physician has recommended you make the following change in your medication INCREASE Flecainide to 100 mg twice a day  *If you need a refill on your cardiac medications before your next appointment, please call your pharmacy*   Lab Work: None ordered If you have labs (blood work) drawn today and your tests are completely normal, you will receive your results only by: MyChart Message (if you have MyChart) OR A paper copy in the mail If you have any lab test that is abnormal or we need to change your treatment, we will call you to review the results.   Testing/Procedures: None ordered   Follow-Up: At [facility], you and your health needs are our priority.  As part of our continuing mission to provide you with exceptional heart care, we have created designated Provider Care Teams.  These Care Teams include your primary Cardiologist (physician) and Advanced Practice Providers (APPs -  Physician Assistants and Nurse Practitioners) who all work together to provide you with the care you need, when you need it.  We recommend signing up for the patient portal called "MyChart".  Sign up information is provided on this After Visit Summary.  MyChart is used to connect with patients for Virtual Visits (Telemedicine).  Patients are able to view lab/test results, encounter notes, upcoming appointments, etc.  Non-urgent messages can be sent to your provider as well.   To learn more about what you can do with MyChart, go to [email or web address].    Your next appointment:   2 week(s)  The format for your next appointment:   In Person  Provider:   For nurse visit EKG  Your physician recommends that you schedule a follow-up appointment in: 3 months with Dr. Elberta Fortis in [place]   You have been referred to Dr .Armanda Magic for sleep apnea/CPAP    Thank you for choosing [facility]!!   Dory Horn, RN [phone]   Other Instructions   Bariatric  Surgery You have so much to gain by losing weight.  You may have already tried every diet and exercise plan imaginable.  And, you may have sought advice from your family physician, too.   Sometimes, in spite of such diligent efforts, you may not be able to achieve long-term results by yourself.  In cases of severe obesity, bariatric or weight loss surgery is a proven method of achieving long-term weight control.  Our Services Our bariatric surgery programs offer our patients new hope and long-term weight-loss solution.  Since introducing our services in 2003, we have conducted more than 2,400 successful procedures.  Our program is designated as a Investment banker, corporate by the Metabolic and Bariatric Surgery Accreditation and Quality Improvement Program (MBSAQIP), a Child psychotherapist that sets rigorous patient safety and outcome standards.  Our program is also designated as a Engineer, manufacturing systems by [employer].   Our exceptional weight-loss surgery team specializes in diagnosis, treatment, follow-up care, and ongoing support for our patients with severe weight loss challenges.  We currently offer laparoscopic sleeve gastrectomy, gastric bypass, and adjustable gastric band (LAP-BAND).    Attend our Bariatrics Seminar Choosing to undergo a bariatric procedure is a big decision, and one that should not be taken lightly.  You now have two options in how you learn about weight-loss surgery - in person or online.  Our objective is to ensure you have all of the information that you need to evaluate the advantages and obligations of this life changing procedure.  Please note that you are not alone in this process, and our experienced team is ready to assist and answer all of your questions.  There are several ways to register for a seminar (either on-line or in person):  Call [phone] Go on-line to [facility] and register for either type of seminar.   [email or web address]

## 2021-09-20 ENCOUNTER — Other Ambulatory Visit: Payer: Self-pay

## 2021-09-20 ENCOUNTER — Ambulatory Visit (INDEPENDENT_AMBULATORY_CARE_PROVIDER_SITE_OTHER): Payer: 59

## 2021-09-20 ENCOUNTER — Institutional Professional Consult (permissible substitution): Payer: 59 | Admitting: Cardiology

## 2021-09-20 VITALS — BP 134/80 | HR 58 | Ht 63.0 in | Wt 360.0 lb

## 2021-09-20 DIAGNOSIS — I480 Paroxysmal atrial fibrillation: Secondary | ICD-10-CM

## 2021-09-20 NOTE — Progress Notes (Signed)
Reason for visit: EKG  Name of MD requesting visit: Camnitz  H&P: Paroxysmal atrial fibrillation  ROS related to problem: Paroxysmal atrial fibrillation increased Flecainide to 100 mg twice daily.  Assessment and plan per MD: EKG reviewed by Dr. Dulce Sellar. Will forward to Dr. Elberta Fortis and Norval Morton, RN.

## 2021-09-27 ENCOUNTER — Ambulatory Visit: Payer: 59 | Admitting: Cardiology

## 2021-09-27 ENCOUNTER — Telehealth: Payer: Self-pay | Admitting: *Deleted

## 2021-09-27 NOTE — Telephone Encounter (Signed)
Pt aware of below advisement/recommendation. Aware I will have Dr. Elberta Fortis review tomorrow to confirm ok to take. Patient verbalized understanding and agreeable to plan.

## 2021-09-27 NOTE — Telephone Encounter (Signed)
Flecainide and Lexapro are both QTc prolonging. Previously took Lexapro 10mg  daily and flecainide 50mg  BID, QTc stable. Now on dose increases of both meds. Will need EKG checked 1-2 weeks after dose increase. If QTc is elevated, agree with decreasing dose of Lexapro back to 10mg .

## 2021-09-27 NOTE — Telephone Encounter (Signed)
Called pt to inform her that she did NOT need the appt this morning with Dr. Elberta Fortis. Appt cancelled, she will keep her regular follow up.   Pt reports that her pharmacy will not fill her new Flecainide Rx of 100 mg BID.   She states that it is b/c of her Lexapro dose.  She thinks it is incorrect in our system and reports that she is taking Lexapro 20 mg daily. Made aware that our system shows her taking 10 mg daily, will update dosage in her chart. Pt aware I will forward to pharmD for review of dosing/interactions. Pt reports that she will be out of her Flecainide after today. She also states that if cannot take the Flecainide w/ 20 mg of Lexapro then she can go back down to 10 mg if can stay on the Flecainide. Pt aware will forward to pharmD for review/advisement and will be in touch afterwards. Patient verbalized understanding and agreeable to plan.

## 2021-09-28 NOTE — Telephone Encounter (Signed)
Spoke to pharmacy. Spoke to pt.  Pharmacy instructed that ok for pt to take both Flecainide 100 mg & Lexapro 20 mg. Patient verbalized understanding and agreeable to plan.

## 2021-10-08 ENCOUNTER — Ambulatory Visit: Payer: 59 | Admitting: Cardiology

## 2021-10-15 DIAGNOSIS — I4891 Unspecified atrial fibrillation: Secondary | ICD-10-CM

## 2021-10-19 DIAGNOSIS — I4891 Unspecified atrial fibrillation: Secondary | ICD-10-CM

## 2021-10-27 ENCOUNTER — Inpatient Hospital Stay (HOSPITAL_COMMUNITY)
Admission: EM | Admit: 2021-10-27 | Discharge: 2021-10-30 | DRG: 291 | Disposition: A | Discharge status: Home / Self Care | Payer: 59 | Attending: Internal Medicine | Admitting: Internal Medicine

## 2021-10-27 ENCOUNTER — Other Ambulatory Visit: Payer: Self-pay

## 2021-10-27 ENCOUNTER — Emergency Department (HOSPITAL_COMMUNITY): Payer: 59

## 2021-10-27 ENCOUNTER — Encounter (HOSPITAL_COMMUNITY): Payer: Self-pay | Admitting: Emergency Medicine

## 2021-10-27 DIAGNOSIS — Z9049 Acquired absence of other specified parts of digestive tract: Secondary | ICD-10-CM

## 2021-10-27 DIAGNOSIS — Z8249 Family history of ischemic heart disease and other diseases of the circulatory system: Secondary | ICD-10-CM

## 2021-10-27 DIAGNOSIS — I10 Essential (primary) hypertension: Secondary | ICD-10-CM | POA: Diagnosis present

## 2021-10-27 DIAGNOSIS — Z87891 Personal history of nicotine dependence: Secondary | ICD-10-CM

## 2021-10-27 DIAGNOSIS — F419 Anxiety disorder, unspecified: Secondary | ICD-10-CM | POA: Diagnosis present

## 2021-10-27 DIAGNOSIS — R32 Unspecified urinary incontinence: Secondary | ICD-10-CM | POA: Diagnosis present

## 2021-10-27 DIAGNOSIS — N179 Acute kidney failure, unspecified: Secondary | ICD-10-CM | POA: Diagnosis present

## 2021-10-27 DIAGNOSIS — T502X5A Adverse effect of carbonic-anhydrase inhibitors, benzothiadiazides and other diuretics, initial encounter: Secondary | ICD-10-CM | POA: Diagnosis present

## 2021-10-27 DIAGNOSIS — E662 Morbid (severe) obesity with alveolar hypoventilation: Secondary | ICD-10-CM | POA: Diagnosis present

## 2021-10-27 DIAGNOSIS — Z20822 Contact with and (suspected) exposure to covid-19: Secondary | ICD-10-CM | POA: Diagnosis present

## 2021-10-27 DIAGNOSIS — I5031 Acute diastolic (congestive) heart failure: Secondary | ICD-10-CM | POA: Diagnosis present

## 2021-10-27 DIAGNOSIS — F32A Depression, unspecified: Secondary | ICD-10-CM | POA: Diagnosis present

## 2021-10-27 DIAGNOSIS — Z7901 Long term (current) use of anticoagulants: Secondary | ICD-10-CM

## 2021-10-27 DIAGNOSIS — E876 Hypokalemia: Secondary | ICD-10-CM | POA: Diagnosis not present

## 2021-10-27 DIAGNOSIS — I480 Paroxysmal atrial fibrillation: Secondary | ICD-10-CM | POA: Diagnosis present

## 2021-10-27 DIAGNOSIS — Z823 Family history of stroke: Secondary | ICD-10-CM

## 2021-10-27 DIAGNOSIS — Z833 Family history of diabetes mellitus: Secondary | ICD-10-CM

## 2021-10-27 DIAGNOSIS — I440 Atrioventricular block, first degree: Secondary | ICD-10-CM | POA: Diagnosis present

## 2021-10-27 DIAGNOSIS — R0600 Dyspnea, unspecified: Secondary | ICD-10-CM | POA: Diagnosis present

## 2021-10-27 DIAGNOSIS — D649 Anemia, unspecified: Secondary | ICD-10-CM | POA: Diagnosis present

## 2021-10-27 DIAGNOSIS — Z79899 Other long term (current) drug therapy: Secondary | ICD-10-CM

## 2021-10-27 DIAGNOSIS — J9601 Acute respiratory failure with hypoxia: Secondary | ICD-10-CM | POA: Diagnosis present

## 2021-10-27 DIAGNOSIS — Z6841 Body Mass Index (BMI) 40.0 and over, adult: Secondary | ICD-10-CM

## 2021-10-27 DIAGNOSIS — Z807 Family history of other malignant neoplasms of lymphoid, hematopoietic and related tissues: Secondary | ICD-10-CM

## 2021-10-27 DIAGNOSIS — R0602 Shortness of breath: Secondary | ICD-10-CM

## 2021-10-27 DIAGNOSIS — I110 Hypertensive heart disease with heart failure: Principal | ICD-10-CM | POA: Diagnosis present

## 2021-10-27 DIAGNOSIS — Z885 Allergy status to narcotic agent status: Secondary | ICD-10-CM

## 2021-10-27 DIAGNOSIS — I509 Heart failure, unspecified: Secondary | ICD-10-CM

## 2021-10-27 DIAGNOSIS — Z560 Unemployment, unspecified: Secondary | ICD-10-CM

## 2021-10-27 LAB — CBC
HCT: 33.8 % — ABNORMAL LOW (ref 36.0–46.0)
Hemoglobin: 10.2 g/dL — ABNORMAL LOW (ref 12.0–15.0)
MCH: 26.4 pg (ref 26.0–34.0)
MCHC: 30.2 g/dL (ref 30.0–36.0)
MCV: 87.3 fL (ref 80.0–100.0)
Platelets: 439 10*3/uL — ABNORMAL HIGH (ref 150–400)
RBC: 3.87 MIL/uL (ref 3.87–5.11)
RDW: 15.3 % (ref 11.5–15.5)
WBC: 9.7 10*3/uL (ref 4.0–10.5)
nRBC: 0 % (ref 0.0–0.2)

## 2021-10-27 LAB — BASIC METABOLIC PANEL
Anion gap: 10 (ref 5–15)
BUN: 7 mg/dL (ref 6–20)
CO2: 31 mmol/L (ref 22–32)
Calcium: 8.9 mg/dL (ref 8.9–10.3)
Chloride: 99 mmol/L (ref 98–111)
Creatinine, Ser: 1.01 mg/dL — ABNORMAL HIGH (ref 0.44–1.00)
GFR, Estimated: >60 mL/min (ref >60)
Glucose, Bld: 114 mg/dL — ABNORMAL HIGH (ref 70–99)
Potassium: 3.9 mmol/L (ref 3.5–5.1)
Sodium: 140 mmol/L (ref 135–145)

## 2021-10-27 LAB — BRAIN NATRIURETIC PEPTIDE: B Natriuretic Peptide: 248.9 pg/mL — ABNORMAL HIGH (ref 0.0–100.0)

## 2021-10-27 LAB — RESP PANEL BY RT-PCR (FLU A&B, COVID) ARPGX2
Influenza A by PCR: NEGATIVE
Influenza B by PCR: NEGATIVE
SARS Coronavirus 2 by RT PCR: NEGATIVE

## 2021-10-27 LAB — TROPONIN I (HIGH SENSITIVITY): Troponin I (High Sensitivity): 12 ng/L (ref <18)

## 2021-10-27 NOTE — ED Provider Notes (Signed)
Emergency Medicine Provider Triage Evaluation Note  Nancy Wu , a 55 y.o. female  was evaluated in triage.  Pt complains of sob x7 days since being admitted for appendicitis. No chest pain or cough. Denies fever.  Review of Systems  Positive: sob Negative: Chest pain, cough, fever  Physical Exam  BP 135/68   Pulse 71   Temp 97.7 F (36.5 C) (Oral)   Resp (!) 22   SpO2 96%  Gen:   Awake, no distress   Resp:  Normal effort  MSK:   Moves extremities without difficulty  Other:  Lungs ctab, heart rrr  Medical Decision Making  Medically screening exam initiated at 7:07 PM.  Appropriate orders placed.  Nancy Wu was informed that the remainder of the evaluation will be completed by another provider, this initial triage assessment does not replace that evaluation, and the importance of remaining in the ED until their evaluation is complete.     Rayne Du 10/27/21 1909    Lorre Nick, MD 10/28/21 1538

## 2021-10-27 NOTE — ED Triage Notes (Signed)
Pt here for shob x1 week after being d/c for appendectomy. Pt reports she followed up today and they told her to come to ER for concern of possible low hemoglobin and possible CHF. Pt reports edema in bilateral feet and hands, gained 15lbs in 1 week.

## 2021-10-28 ENCOUNTER — Observation Stay (HOSPITAL_COMMUNITY): Payer: 59

## 2021-10-28 ENCOUNTER — Emergency Department (HOSPITAL_COMMUNITY): Payer: 59

## 2021-10-28 DIAGNOSIS — E662 Morbid (severe) obesity with alveolar hypoventilation: Secondary | ICD-10-CM | POA: Diagnosis present

## 2021-10-28 DIAGNOSIS — I5031 Acute diastolic (congestive) heart failure: Secondary | ICD-10-CM | POA: Diagnosis present

## 2021-10-28 DIAGNOSIS — R0609 Other forms of dyspnea: Secondary | ICD-10-CM

## 2021-10-28 DIAGNOSIS — R32 Unspecified urinary incontinence: Secondary | ICD-10-CM | POA: Diagnosis present

## 2021-10-28 DIAGNOSIS — T502X5A Adverse effect of carbonic-anhydrase inhibitors, benzothiadiazides and other diuretics, initial encounter: Secondary | ICD-10-CM | POA: Diagnosis present

## 2021-10-28 DIAGNOSIS — Z807 Family history of other malignant neoplasms of lymphoid, hematopoietic and related tissues: Secondary | ICD-10-CM | POA: Diagnosis not present

## 2021-10-28 DIAGNOSIS — I509 Heart failure, unspecified: Secondary | ICD-10-CM

## 2021-10-28 DIAGNOSIS — J9601 Acute respiratory failure with hypoxia: Secondary | ICD-10-CM | POA: Diagnosis present

## 2021-10-28 DIAGNOSIS — Z9049 Acquired absence of other specified parts of digestive tract: Secondary | ICD-10-CM | POA: Diagnosis not present

## 2021-10-28 DIAGNOSIS — Z6841 Body Mass Index (BMI) 40.0 and over, adult: Secondary | ICD-10-CM | POA: Diagnosis not present

## 2021-10-28 DIAGNOSIS — I480 Paroxysmal atrial fibrillation: Secondary | ICD-10-CM | POA: Diagnosis present

## 2021-10-28 DIAGNOSIS — Z8249 Family history of ischemic heart disease and other diseases of the circulatory system: Secondary | ICD-10-CM | POA: Diagnosis not present

## 2021-10-28 DIAGNOSIS — Z833 Family history of diabetes mellitus: Secondary | ICD-10-CM | POA: Diagnosis not present

## 2021-10-28 DIAGNOSIS — Z79899 Other long term (current) drug therapy: Secondary | ICD-10-CM | POA: Diagnosis not present

## 2021-10-28 DIAGNOSIS — I440 Atrioventricular block, first degree: Secondary | ICD-10-CM | POA: Diagnosis present

## 2021-10-28 DIAGNOSIS — I10 Essential (primary) hypertension: Secondary | ICD-10-CM | POA: Diagnosis not present

## 2021-10-28 DIAGNOSIS — F32A Depression, unspecified: Secondary | ICD-10-CM | POA: Diagnosis present

## 2021-10-28 DIAGNOSIS — Z885 Allergy status to narcotic agent status: Secondary | ICD-10-CM | POA: Diagnosis not present

## 2021-10-28 DIAGNOSIS — Z560 Unemployment, unspecified: Secondary | ICD-10-CM | POA: Diagnosis not present

## 2021-10-28 DIAGNOSIS — N179 Acute kidney failure, unspecified: Secondary | ICD-10-CM | POA: Diagnosis present

## 2021-10-28 DIAGNOSIS — I110 Hypertensive heart disease with heart failure: Secondary | ICD-10-CM | POA: Diagnosis present

## 2021-10-28 DIAGNOSIS — Z87891 Personal history of nicotine dependence: Secondary | ICD-10-CM | POA: Diagnosis not present

## 2021-10-28 DIAGNOSIS — Z7901 Long term (current) use of anticoagulants: Secondary | ICD-10-CM | POA: Diagnosis not present

## 2021-10-28 DIAGNOSIS — Z823 Family history of stroke: Secondary | ICD-10-CM | POA: Diagnosis not present

## 2021-10-28 DIAGNOSIS — E876 Hypokalemia: Secondary | ICD-10-CM | POA: Diagnosis not present

## 2021-10-28 DIAGNOSIS — R0602 Shortness of breath: Secondary | ICD-10-CM | POA: Diagnosis not present

## 2021-10-28 DIAGNOSIS — D649 Anemia, unspecified: Secondary | ICD-10-CM | POA: Diagnosis present

## 2021-10-28 DIAGNOSIS — Z20822 Contact with and (suspected) exposure to covid-19: Secondary | ICD-10-CM | POA: Diagnosis present

## 2021-10-28 HISTORY — DX: Heart failure, unspecified: I50.9

## 2021-10-28 LAB — ECHOCARDIOGRAM COMPLETE
Area-P 1/2: 3.08 cm2
Calc EF: 50.9 %
S' Lateral: 3.1 cm
Single Plane A2C EF: 47.8 %
Single Plane A4C EF: 52.4 %

## 2021-10-28 LAB — TROPONIN I (HIGH SENSITIVITY): Troponin I (High Sensitivity): 14 ng/L (ref <18)

## 2021-10-28 MED ORDER — GABAPENTIN 100 MG PO CAPS
800.0000 mg | ORAL_CAPSULE | Freq: Three times a day (TID) | ORAL | Status: DC
  Administered 2021-10-28 – 2021-10-30 (×5): 800 mg via ORAL
  Filled 2021-10-28 (×5): qty 8

## 2021-10-28 MED ORDER — LISINOPRIL-HYDROCHLOROTHIAZIDE 20-12.5 MG PO TABS: 1.0000 | ORAL_TABLET | Freq: Every day | ORAL | Status: DC

## 2021-10-28 MED ORDER — POLYETHYLENE GLYCOL 3350 17 G PO PACK: 17.0000 g | PACK | Freq: Every day | ORAL | Status: DC | PRN

## 2021-10-28 MED ORDER — APIXABAN 5 MG PO TABS
5.0000 mg | ORAL_TABLET | Freq: Two times a day (BID) | ORAL | Status: DC
  Administered 2021-10-28 – 2021-10-30 (×5): 5 mg via ORAL
  Filled 2021-10-28 (×5): qty 1

## 2021-10-28 MED ORDER — ADULT MULTIVITAMIN W/MINERALS CH
1.0000 | ORAL_TABLET | Freq: Every day | ORAL | Status: DC
  Administered 2021-10-28 – 2021-10-30 (×3): 1 via ORAL
  Filled 2021-10-28 (×3): qty 1

## 2021-10-28 MED ORDER — LISINOPRIL 20 MG PO TABS
20.0000 mg | ORAL_TABLET | Freq: Every day | ORAL | Status: DC
  Administered 2021-10-28 – 2021-10-30 (×3): 20 mg via ORAL
  Filled 2021-10-28 (×3): qty 1

## 2021-10-28 MED ORDER — ESCITALOPRAM OXALATE 10 MG PO TABS
20.0000 mg | ORAL_TABLET | Freq: Every day | ORAL | Status: DC
  Administered 2021-10-28 – 2021-10-30 (×3): 20 mg via ORAL
  Filled 2021-10-28 (×3): qty 2

## 2021-10-28 MED ORDER — AMOXICILLIN-POT CLAVULANATE 875-125 MG PO TABS
1.0000 | ORAL_TABLET | Freq: Two times a day (BID) | ORAL | Status: AC
  Administered 2021-10-28 – 2021-10-29 (×4): 1 via ORAL
  Filled 2021-10-28 (×4): qty 1

## 2021-10-28 MED ORDER — HYDROCHLOROTHIAZIDE 12.5 MG PO TABS
12.5000 mg | ORAL_TABLET | Freq: Every day | ORAL | Status: DC
  Administered 2021-10-28 – 2021-10-30 (×3): 12.5 mg via ORAL
  Filled 2021-10-28 (×3): qty 1

## 2021-10-28 MED ORDER — LISINOPRIL 20 MG PO TABS: 20.0000 mg | ORAL_TABLET | Freq: Every day | ORAL | Status: DC

## 2021-10-28 MED ORDER — IOHEXOL 350 MG/ML SOLN
70.0000 mL | Freq: Once | INTRAVENOUS | Status: AC | PRN
  Administered 2021-10-28: 70 mL via INTRAVENOUS

## 2021-10-28 MED ORDER — FLECAINIDE ACETATE 100 MG PO TABS
100.0000 mg | ORAL_TABLET | Freq: Two times a day (BID) | ORAL | Status: DC
  Administered 2021-10-28 – 2021-10-30 (×4): 100 mg via ORAL
  Filled 2021-10-28 (×7): qty 1

## 2021-10-28 MED ORDER — ACETAMINOPHEN 325 MG PO TABS
650.0000 mg | ORAL_TABLET | Freq: Four times a day (QID) | ORAL | Status: DC | PRN
  Administered 2021-10-29: 650 mg via ORAL
  Filled 2021-10-28: qty 2

## 2021-10-28 MED ORDER — FUROSEMIDE 10 MG/ML IJ SOLN
40.0000 mg | Freq: Once | INTRAMUSCULAR | Status: AC
  Administered 2021-10-28: 40 mg via INTRAVENOUS
  Filled 2021-10-28: qty 4

## 2021-10-28 MED ORDER — HYDROCHLOROTHIAZIDE 12.5 MG PO TABS: 12.5000 mg | ORAL_TABLET | Freq: Every day | ORAL | Status: DC

## 2021-10-28 MED ORDER — ACETAMINOPHEN 650 MG RE SUPP: 650.0000 mg | Freq: Four times a day (QID) | RECTAL | Status: DC | PRN

## 2021-10-28 MED ORDER — SODIUM CHLORIDE 0.9% FLUSH
3.0000 mL | Freq: Two times a day (BID) | INTRAVENOUS | Status: DC
  Administered 2021-10-28 – 2021-10-29 (×4): 3 mL via INTRAVENOUS

## 2021-10-28 MED ORDER — FUROSEMIDE 10 MG/ML IJ SOLN
40.0000 mg | Freq: Two times a day (BID) | INTRAMUSCULAR | Status: DC
  Administered 2021-10-28 – 2021-10-29 (×3): 40 mg via INTRAVENOUS
  Filled 2021-10-28 (×3): qty 4

## 2021-10-28 NOTE — ED Notes (Signed)
Phone report given to Greggory Stallion, Charity fundraiser.  All questions answered.

## 2021-10-28 NOTE — ED Provider Notes (Signed)
[facility] EMERGENCY DEPARTMENT Provider Note   CSN: [id number] Arrival date & time: 10/27/21  1734     History Chief Complaint  Patient presents with   Shortness of Breath    Nancy Wu is a 55 y.o. female.  55 yo F with a cc of shortness of breath on exertion.  This been going on for about a week now.  The patient was just in the hospital in [place] and had her appendix removed.  Michela Pitcher that she had a small area of perforation and there is some concern for systemic infection.  She feels quite a bit better as far as her abdomen goes but started experiencing some shortness of breath and chest heaviness when she gets up to walk around.  Has also been gaining weight over the course of this hospitalization.  She denies any history of PE or DVT denies hemoptysis denies unilateral lower extremity edema.  She is on Eliquis for atrial fibrillation and was off it for a few days due to the procedure.  She thinks that she has been eating and drinking a little bit less than normal.  Denies history of heart failure.  She sleeps with a CPAP machine and so is unable to lay back flat normally.  The history is provided by the patient and the spouse.  Shortness of Breath Severity:  Moderate Onset quality:  Gradual Duration:  1 week Timing:  Constant Progression:  Worsening Chronicity:  New Relieved by:  Nothing Worsened by:  Nothing Ineffective treatments:  None tried Associated symptoms: chest pain (heaviness on exertion)   Associated symptoms: no cough, no fever, no headaches, no vomiting and no wheezing       Past Medical History:  Diagnosis Date   Acute pharyngitis due to other specified organisms 10/14/2020   ALLERGIC RHINITIS 08/20/2007   Qualifier: Diagnosis of  By: Hassell Done FNP, [place]     Anxiety    Borderline diabetes    Chest pain 05/08/2013   Normal coronary arteriography in November 2017   Chronic anticoagulation 09/26/2017   Chronic low back pain  01/25/2007   Qualifier: Diagnosis of  By: Beryle Lathe     Depression    Dyspnea 02/12/2021   Essential hypertension 01/25/2007   Qualifier: Diagnosis of  By: Beryle Lathe     Gastroesophageal reflux disease without esophagitis 09/29/2016   Heart disease    afib   High risk medication use 11/02/2016   Overview:  [place]   HYPERTENSION, BENIGN SYSTEMIC 01/25/2007   Qualifier: Diagnosis of  By: Beryle Lathe     Hypertensive heart disease without heart failure 09/29/2016   Ingrown toenail 10/21/2012   IUD 2007   Lumbago with sciatica of left side    Migraine 07/22/2011   Morbid obesity ([place])    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/14/2017   Obstructive sleep apnea treated with continuous positive airway pressure (CPAP) 02/12/2018   Palpitations 02/12/2021   Paroxysmal atrial fibrillation ([place]) 09/29/2016   PRESENCE OF IUD 11/28/2004   Qualifier: Diagnosis of  By: Hassell Done FNP, [place]     Reactive depression 01/25/2007   Qualifier: Diagnosis of  By: Beryle Lathe     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   Right knee meniscal tear 12/15/2011   Sciatica 01/25/2007   Qualifier: Diagnosis of  By: Beryle Lathe     Strain of left hamstring 06/01/2019    Patient Active Problem List   Diagnosis Date  Noted   Heart disease    Dyspnea 02/12/2021   Palpitations 02/12/2021   Acute pharyngitis due to other specified organisms 10/14/2020   Aftercare following surgery of the musculoskeletal system 09/27/2019   Lipid screening 07/16/2019   Acute medial meniscus tear of left knee 07/14/2019   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   Strain of left hamstring 06/01/2019   Obstructive sleep apnea treated with continuous positive airway pressure (CPAP) 02/12/2018   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[place]) 11/14/2017   Morbid obesity with body mass index (BMI) of 50.0 to 59.9 in adult [facility])  11/14/2017   Chronic anticoagulation 09/26/2017   Morbid obesity ([place])    Depression    Anxiety    High risk medication use 11/02/2016   Gastroesophageal reflux disease without esophagitis 09/29/2016   Hypertensive heart disease without heart failure 09/29/2016   Paroxysmal atrial fibrillation ([place]) 09/29/2016   Chest pain 05/08/2013   Ingrown toenail 10/21/2012   Right knee meniscal tear 12/15/2011   Migraine 07/22/2011   Borderline diabetes 04/18/2011   ALLERGIC RHINITIS 08/20/2007   Obesity 01/25/2007   Reactive depression 01/25/2007   Essential hypertension 01/25/2007   Chronic low back pain 01/25/2007   SCIATICA 01/25/2007   HYPERTENSION, BENIGN SYSTEMIC 01/25/2007   PRESENCE OF IUD 11/28/2004    Past Surgical History:  Procedure Laterality Date   APPENDECTOMY     CARDIAC CATHETERIZATION Bilateral 09/29/2016   [place]   CESAREAN SECTION  1989   CHOLECYSTECTOMY  2010   KNEE SURGERY Bilateral    TUBAL LIGATION  1989     OB History     Gravida  2   Para      Term      Preterm      AB      Living  2      SAB      IAB      Ectopic      Multiple      Live Births              Family History  Problem Relation Age of Onset   Depression Mother    Hypertension Mother    Stroke Father    Hypertension Father    Melanoma Father    Lymphoma Father    Cancer Other    Hypertension Other    Stroke Other    Heart disease Other    Diabetes Other    Depression Sister    Alcohol abuse Brother     Social History   Tobacco Use   Smoking status: Former   Smokeless tobacco: Never  Substance Use Topics   Alcohol use: Yes    Comment: Occasional.   Drug use: No    Home Medications Prior to Admission medications   Medication Sig Start Date End Date Taking? Authorizing Provider  amoxicillin-clavulanate (AUGMENTIN) 875-125 MG tablet Take 1 tablet by mouth 2 (two) times daily. 10/20/21   [provider]  apixaban (ELIQUIS) 5 MG TABS  tablet TAKE 1 TABLET BY MOUTH TWICE A DAY 06/15/21   Tobb, Kardie, DO  escitalopram (LEXAPRO) 10 MG tablet Take 20 mg by mouth daily. 04/16/21   [provider]  escitalopram (LEXAPRO) 20 MG tablet Take 20 mg by mouth daily. 10/03/21   [provider]  flecainide (TAMBOCOR) 100 MG tablet Take 1 tablet (100 mg total) by mouth 2 (two) times daily. 09/06/21   Constance Haw, MD  gabapentin (NEURONTIN) 800 MG tablet Take 800 mg by mouth 3 (three) times daily.    [provider]  lisinopril-hydrochlorothiazide (ZESTORETIC) 20-12.5 MG tablet Take 1 tablet by mouth daily. 03/01/21   Tobb, Kardie, DO  metoprolol succinate (TOPROL XL) 25 MG 24 hr tablet Take 0.5 tablets (12.5 mg total) by mouth daily. 03/29/21   Tobb, Kardie, DO  Multiple Vitamins-Minerals (ONE-A-DAY 50 PLUS) TABS Take 1 tablet by mouth daily.    [provider]  oxyCODONE (OXY IR/ROXICODONE) 5 MG immediate release tablet Take 5 mg by mouth every 4 (four) hours as needed. 10/20/21   [provider]  pantoprazole (PROTONIX) 40 MG tablet Take 1 tablet by mouth daily. 09/28/16   [provider]    Allergies    Codeine  Review of Systems   Review of Systems  Constitutional:  Negative for chills and fever.  HENT:  Negative for congestion and rhinorrhea.   Eyes:  Negative for redness and visual disturbance.  Respiratory:  Positive for shortness of breath. Negative for cough and wheezing.   Cardiovascular:  Positive for chest pain (heaviness on exertion). Negative for palpitations.  Gastrointestinal:  Negative for nausea and vomiting.  Genitourinary:  Negative for dysuria and urgency.  Musculoskeletal:  Negative for arthralgias and myalgias.  Skin:  Negative for pallor and wound.  Neurological:  Negative for dizziness and headaches.   Physical Exam Updated Vital Signs BP (!) 138/49 (BP Location: Right Arm)   Pulse (!) 51   Temp 98.5 F (36.9 C) (Oral)   Resp 16   SpO2 100%    Physical Exam Vitals and nursing note reviewed.  Constitutional:      General: She is not in acute distress.    Appearance: She is well-developed. She is not diaphoretic.     Comments: BMI 63.8  HENT:     Head: Normocephalic and atraumatic.  Eyes:     Pupils: Pupils are equal, round, and reactive to light.  Cardiovascular:     Rate and Rhythm: Normal rate and regular rhythm.     Heart sounds: No murmur heard.   No friction rub. No gallop.  Pulmonary:     Effort: Pulmonary effort is normal.     Breath sounds: No wheezing or rales.  Abdominal:     General: There is no distension.     Palpations: Abdomen is soft.     Tenderness: There is no abdominal tenderness.     Comments: Laparoscopy incisions clean dry and intact.  No appreciable abdominal discomfort.  Musculoskeletal:        General: No tenderness.     Cervical back: Normal range of motion and neck supple.  Skin:    General: Skin is warm and dry.  Neurological:     Mental Status: She is alert and oriented to person, place, and time.  Psychiatric:        Behavior: Behavior normal.    ED Results / Procedures / Treatments   Labs (all labs ordered are listed, but only abnormal results are displayed) Labs Reviewed  BASIC METABOLIC PANEL - Abnormal; Notable for the following components:      Result Value   Glucose, Bld 114 (*)    Creatinine, Ser 1.01 (*)    All other components within normal limits  CBC - Abnormal; Notable for the following components:   Hemoglobin 10.2 (*)    HCT 33.8 (*)    Platelets 439 (*)    All other components within normal limits  BRAIN NATRIURETIC PEPTIDE - Abnormal; Notable for the following components:   B Natriuretic Peptide 248.9 (*)    All other components within normal limits  RESP PANEL BY RT-PCR (FLU A&B, COVID) ARPGX2  TROPONIN I (HIGH SENSITIVITY)  TROPONIN I (HIGH SENSITIVITY)    EKG EKG Interpretation  Date/Time:  Wednesday October 27 2021 18:45:09 EST Ventricular  Rate:  64 PR Interval:  204 QRS Duration: 98 QT Interval:  438 QTC Calculation: 451 R Axis:   245 Text Interpretation: Normal sinus rhythm Right superior axis deviation Low voltage QRS Cannot rule out Anterior infarct , age undetermined Abnormal ECG No significant change since last tracing Confirmed by Deno Etienne [phone]) on 10/28/2021 8:57:39 AM  Radiology DG Chest 1 View  Result Date: 10/27/2021 CLINICAL DATA:  Shortness of breath EXAM: CHEST  1 VIEW COMPARISON:  07/25/2021, 10/17/2020 FINDINGS: Cardiomegaly with vascular congestion and diffuse interstitial opacity most consistent with edema. No pleural effusion or pneumothorax. IMPRESSION: Cardiomegaly with vascular congestion and interstitial pulmonary edema. Electronically Signed   By: Donavan Foil M.D.   On: 10/27/2021 20:05    Procedures Procedures   Medications Ordered in ED Medications - No data to display  ED Course  I have reviewed the triage vital signs and the nursing notes.  Pertinent labs & imaging results that were available during my care of the patient were reviewed by me and considered in my medical decision making (see chart for details).    MDM Rules/Calculators/A&P                           55 yo F with a chief complaints of shortness of breath on exertion.  This been going on for the past week.  The patient was recently hospitalized for appendicitis.  Had a laparoscopic surgery and has been doing much better from her abdominal discomfort but has had weight gain and edema to her legs and hands.  No history of heart failure.  Minimal edema noted on my exam though exam is somewhat limited due to body habitus.  Chest x-ray viewed by me with possible increased fluid.  BNP just over 200.  Less likely to be a pulmonary embolism with Eliquis use but the patient was off and had a recent surgery and has new exertional shortness of breath.  We will obtain a CT angiogram of the chest to further evaluate.  Patient  ambulated and pulse ox went down into the low 80s.  Significantly short of breath with this effort as well.  Awaiting CT.  CT scan consistent with pulmonary edema.  Discussed with medicine for admission.  CRITICAL CARE Performed by: Cecilio Asper   Total critical care time: 35 minutes  Critical care time was exclusive of separately billable procedures and treating other patients.  Critical care was necessary to treat or prevent imminent or life-threatening deterioration.  Critical care was time spent personally by me on the following activities: development of treatment plan with patient and/or surrogate as well as nursing, discussions with consultants, evaluation of patient's response to treatment, examination of patient, obtaining history from patient or surrogate, ordering and performing treatments and interventions, ordering and review of laboratory studies, ordering and review of radiographic studies, pulse oximetry and re-evaluation of patient's condition.  The patients results and plan were reviewed and discussed.   Any x-rays performed were independently reviewed by myself.   Differential diagnosis were considered with the presenting HPI.  Medications  sodium chloride  flush (NS) 0.9 % injection 3 mL (3 mLs Intravenous Given 10/28/21 2239)  acetaminophen (TYLENOL) tablet 650 mg (has no administration in time range)    Or  acetaminophen (TYLENOL) suppository 650 mg (has no administration in time range)  polyethylene glycol (MIRALAX / GLYCOLAX) packet 17 g (has no administration in time range)  multivitamin with minerals tablet 1 tablet (1 tablet Oral Given 10/28/21 1415)  furosemide (LASIX) injection 40 mg (40 mg Intravenous Given 10/28/21 2128)  amoxicillin-clavulanate (AUGMENTIN) 875-125 MG per tablet 1 tablet (1 tablet Oral Given 10/28/21 2236)  apixaban (ELIQUIS) tablet 5 mg (5 mg Oral Given 10/28/21 2236)  escitalopram (LEXAPRO) tablet 20 mg (20 mg Oral Given 10/28/21 1417)   flecainide (TAMBOCOR) tablet 100 mg (100 mg Oral Not Given 10/29/21 0252)  gabapentin (NEURONTIN) capsule 800 mg (800 mg Oral Given 10/28/21 2126)  lisinopril (ZESTRIL) tablet 20 mg (20 mg Oral Given 10/28/21 1415)    And  hydrochlorothiazide (HYDRODIURIL) tablet 12.5 mg (12.5 mg Oral Given 10/28/21 1414)  potassium chloride SA (KLOR-CON M) CR tablet 40 mEq (40 mEq Oral Given 10/29/21 0649)  potassium chloride 10 mEq in 100 mL IVPB (10 mEq Intravenous New Bag/Given 10/29/21 0652)  iohexol (OMNIPAQUE) 350 MG/ML injection 70 mL (70 mLs Intravenous Contrast Given 10/28/21 1018)  furosemide (LASIX) injection 40 mg (40 mg Intravenous Given 10/28/21 1419)    Vitals:   10/28/21 2200 10/28/21 2314 10/29/21 0253 10/29/21 0505  BP:  (!) 150/59  138/72  Pulse:  (!) 58  (!) 56  Resp:  (!) 22  20  Temp:  98.2 F (36.8 C)  98.9 F (37.2 C)  TempSrc:  Oral  Oral  SpO2:  98%  97%  Weight: (!) 163.1 kg  (!) 163.1 kg   Height: 5\' 3"  (1.6 m)       Final diagnoses:  SOB (shortness of breath)    Admission/ observation were discussed with the admitting physician, patient and/or family and they are comfortable with the plan.    Final Clinical Impression(s) / ED Diagnoses Final diagnoses:  SOB (shortness of breath)    Rx / DC Orders ED Discharge Orders     None        , DO 10/29/21 14/02/22

## 2021-10-28 NOTE — ED Notes (Signed)
Patient ambulating to restroom without assistance.  Steady gait noted

## 2021-10-28 NOTE — H&P (Signed)
Date: 10/28/2021               Patient Name:  Nancy Wu MRN: [id number]  DOB: [birth year] Age / Sex: 55 y.o., female   PCP: [facility] [place] Service: Internal Medicine Teaching Service         Attending Physician: Dr. Lucious Groves, DO    First Contact: Dr. Vinetta Bergamo Pager: [phone]  Second Contact: Dr. Allyson Sabal Pager: [phone]       After Hours (After 5p/  First Contact Pager: [phone]  weekends / holidays): Second Contact Pager: [phone]   Chief Complaint: SOB  History of Present Illness:   Nancy Wu is a 55 y/o female with a PMHx of A. Fib on Eliquis and Flecainide, HTN, OSA and recent appendicitis s/p appendectomy who presents to the ED with c/o SOB.   Nancy Wu states that she was recently hospitalized at [facility] for acute appendicitis complicated by appendix perforation.  Her hospital stay was further complicated by 2 days in the ICU requiring pressor support due to hypotension.  She states surgery was on 11/18 and she was discharged on 11/23.  During her stay, she received many IV fluids and IV antibiotics.  She notes a 12 pound weight gain during her hospital stay.  Since discharge, she has gained an additional 4 pounds putting her at 16 pounds over her usual weight.  Nancy Wu notes mild dyspnea on exertion that developed in the last 2 days of her hospital stay that progressively worsened since she been home.  She denies any frank orthopnea but states she always sleeps sitting up due to her CPAP.  She endorses bilateral lower extremity swelling that she has never experienced before.  She denies any fever, chills, abdominal pain, abdominal distention, chest pain or palpitations.  She denies any prior history of heart failure.  ED Course:  On arrival to the ED, patient's blood pressure was 135/68 with a heart rate of 71 and respiratory rate of 22 with oxygen saturation of 96%.  She was afebrile at 97.7.  With  ambulation, oxygen saturation dropped to 81% but recovered up to 90% with addition of 2 L supplemental oxygen via nasal cannula.  Initial blood work was significant for normocytic anemia with hemoglobin of 10.2, creatinine of 1.01, BNP of 248.  Troponins were flat at 12 and 14.  Chest x-ray was obtained that showed cardiomegaly with vascular congestion and interstitial pulmonary edema.  CTA was pursued to rule out PE; results were negative for PE but did show interstitial and pulmonary edema.  IMTS was consulted for admission.  Meds:  Current Meds  Medication Sig   amoxicillin-clavulanate (AUGMENTIN) 875-125 MG tablet Take 1 tablet by mouth 2 (two) times daily.   apixaban (ELIQUIS) 5 MG TABS tablet TAKE 1 TABLET BY MOUTH TWICE A DAY   escitalopram (LEXAPRO) 20 MG tablet Take 20 mg by mouth daily.   flecainide (TAMBOCOR) 100 MG tablet Take 1 tablet (100 mg total) by mouth 2 (two) times daily.   gabapentin (NEURONTIN) 800 MG tablet Take 800 mg by mouth 3 (three) times daily.   lisinopril-hydrochlorothiazide (ZESTORETIC) 20-12.5 MG tablet Take 1 tablet by mouth daily.   metoprolol succinate (TOPROL XL) 25 MG 24 hr tablet Take 0.5 tablets (12.5 mg total) by mouth daily.   oxyCODONE (OXY IR/ROXICODONE) 5 MG immediate release tablet Take 5 mg by mouth every 4 (four) hours as needed.  pantoprazole (PROTONIX) 40 MG tablet Take 1 tablet by mouth daily.   Allergies: Allergies as of 10/27/2021 - Review Complete 10/27/2021  Allergen Reaction Noted   Codeine Nausea Only    Past Medical History:  Diagnosis Date   Acute pharyngitis due to other specified organisms 10/14/2020   ALLERGIC RHINITIS 08/20/2007   Qualifier: Diagnosis of  By: Daphine Deutscher FNP, [place]     Anxiety    Borderline diabetes    Chest pain 05/08/2013   Normal coronary arteriography in November 2017   Chronic anticoagulation 09/26/2017   Chronic low back pain 01/25/2007   Qualifier: Diagnosis of  By: Bradly Bienenstock     Depression     Dyspnea 02/12/2021   Essential hypertension 01/25/2007   Qualifier: Diagnosis of  By: Bradly Bienenstock     Gastroesophageal reflux disease without esophagitis 09/29/2016   Heart disease    afib   High risk medication use 11/02/2016   Overview:  [place]   HYPERTENSION, BENIGN SYSTEMIC 01/25/2007   Qualifier: Diagnosis of  By: Bradly Bienenstock     Hypertensive heart disease without heart failure 09/29/2016   Ingrown toenail 10/21/2012   IUD 2007   Lumbago with sciatica of left side    Migraine 07/22/2011   Morbid obesity (HCC)    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HCC) 11/14/2017   Obstructive sleep apnea treated with continuous positive airway pressure (CPAP) 02/12/2018   Palpitations 02/12/2021   Paroxysmal atrial fibrillation (HCC) 09/29/2016   PRESENCE OF IUD 11/28/2004   Qualifier: Diagnosis of  By: Daphine Deutscher FNP, [place]kedtra     Reactive depression 01/25/2007   Qualifier: Diagnosis of  By: Bradly Bienenstock     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 Currently unemployed.  - Former tobacco use, however quit 15 years ago - Drinks alcohol only socially - Denies any illicit drug use - Independent in all ADLs  Review of Systems: A complete ROS was negative except as per HPI.   Physical Exam: Blood pressure (!) 155/57, pulse (!) 54, temperature 98.5 F (36.9 C), temperature source Oral, resp. rate 18, SpO2 90 %.  Physical  Exam Vitals and nursing note reviewed.  Constitutional:      Appearance: She is morbidly obese. She is not ill-appearing.  HENT:     Head: Normocephalic and atraumatic.  Eyes:     Extraocular Movements: Extraocular movements intact.     Pupils: Pupils are equal, round, and reactive to light.  Neck:     Comments: Unable to assess for JVD Cardiovascular:     Rate and Rhythm: Normal rate and regular rhythm.     Heart sounds: No murmur heard.   No gallop.  Pulmonary:     Effort: Pulmonary effort is normal. No tachypnea.  Breath sounds: Examination of the right-lower field reveals decreased breath sounds. Examination of the left-lower field reveals decreased breath sounds. Decreased breath sounds present. No wheezing, rhonchi or rales.     Comments: On 2L supplemental oxygen.  Abdominal:     General: Bowel sounds are normal.     Palpations: Abdomen is soft.     Tenderness: There is no abdominal tenderness.  Musculoskeletal:     Right lower leg: Edema (trace pitting) present.     Left lower leg: Edema (trace pitting) present.  Skin:    General: Skin is warm and dry.     Comments: Surgical incisions on abdomen are healing well with no surrounding erythema or purulent drainage  Neurological:     General: No focal deficit present.     Mental Status: She is alert and oriented to person, place, and time.  Psychiatric:        Mood and Affect: Mood normal.        Behavior: Behavior normal.   EKG: personally reviewed my interpretation is: Sinus bradycardia with borderline 1st degree AV block. No ST or T wave changes consistent with acute ischemia.   CXR: personally reviewed my interpretation is: Under-exposed. Difficult to visualize costophrenic angles. Vascular congestion present.   Assessment & Plan by Problem: Principal Problem:   Acute heart failure [facility])  Nancy Wu is a 55 year old female with a past medical history of A. fib on Eliquis, hypertension, OSA on CPAP, and  recent appendicitis s/p appendectomy complicated by hypotension requiring pressor support who presents to the ED with complaints of dyspnea on exertion and currently admitted for acute heart failure.  # Acute Heart Failure  Patient's history of significant IV fluids during recent hospitalization with 16 pound weight gain and subsequent development of dyspnea with bilateral peripheral edema is consistent with new onset heart failure. BNP elevated at 248. I do not suspect this is due to acute ischemia with normal stress test in August 2022.  Although patient has had an echocardiogram in April 2022 that showed preserved ejection fraction, will repeat echo at this time to evaluate for any changes in LV function.  - Admit for observation - Continue supplemental oxygen to maintain oxygen saturation above 88%.  Wean as tolerated - Lasix 40 mg IV twice daily - TTE pending - Strict in/out - Monitor daily weights  # Normocytic Anemia Per patient, this is new for her. Suspect secondary to recent surgery resulting in blood loss anemia. Low suspicion for active ongoing bleed.   - Continue to monitor hemoglobin daily while admitted  # Acute Kidney Injury Creatinine elevated on admission at 1.01. Labs from 2020 show creatinine of approximately 0.9, however this is inadequate to provide baseline function. Will work on obtaining records from [facility]. If current elevation is truly acute, it may be secondary to vascular congestion.   - Monitor creatinine daily - Strict in/out   # Paroxysmal Atrial Fibrillation  Patient has a long-term history of paroxysmal atrial fibrillation currently being managed with Eliquis, metoprolol and Flecainide.  Since being in the ED, patient has been predominantly bradycardic with EKG showing sinus bradycardia with borderline first-degree AV block.  Due to this, will plan on holding metoprolol for now but will resume Eliquis and flecainide.  - Restart home Eliquis -  Restart home flecainide - Hold home metoprolol  # Hypertension  Home medications include lisinopril-HCTZ.  Patient is hypertensive at this time, so we will restart home meds.  - Restart lisinopril-HCTZ 20-12.5  mg daily  # Obstructive Sleep Apnea - CPAP nightly  Diet: Heart Healthy VTE:  Eliquis IVF: None,None Code: Full  Prior to Admission Living Arrangement: Home, living in [place] Anticipated Discharge Location: Home Barriers to Discharge: Oxygen requirement; additional medical evaluation  Dispo: Admit patient to Observation with expected length of stay less than 2 midnights.  Signed: Dr. Jose Persia Internal Medicine PGY-3 Pager: [phone] After 5pm on weekdays and 1pm on weekends: On Call pager [phone]  10/28/2021, 11:53 AM

## 2021-10-28 NOTE — ED Notes (Signed)
Pt has ambulated to the BR x4, unable to assess output due to hallway status. Pt reports improved breathing since lasix admin, less work of breathing noted.

## 2021-10-28 NOTE — Progress Notes (Signed)
  Echocardiogram 2D Echocardiogram has been performed.  Nancy Wu 10/28/2021, 3:52 PM

## 2021-10-29 DIAGNOSIS — I509 Heart failure, unspecified: Secondary | ICD-10-CM | POA: Diagnosis not present

## 2021-10-29 DIAGNOSIS — I110 Hypertensive heart disease with heart failure: Principal | ICD-10-CM

## 2021-10-29 DIAGNOSIS — I480 Paroxysmal atrial fibrillation: Secondary | ICD-10-CM

## 2021-10-29 LAB — CBC WITH DIFFERENTIAL/PLATELET
Abs Immature Granulocytes: 0.09 10*3/uL — ABNORMAL HIGH (ref 0.00–0.07)
Basophils Absolute: 0 10*3/uL (ref 0.0–0.1)
Basophils Relative: 0 %
Eosinophils Absolute: 0.1 10*3/uL (ref 0.0–0.5)
Eosinophils Relative: 1 %
HCT: 33.2 % — ABNORMAL LOW (ref 36.0–46.0)
Hemoglobin: 10.5 g/dL — ABNORMAL LOW (ref 12.0–15.0)
Immature Granulocytes: 1 %
Lymphocytes Relative: 20 %
Lymphs Abs: 1.8 10*3/uL (ref 0.7–4.0)
MCH: 26.6 pg (ref 26.0–34.0)
MCHC: 31.6 g/dL (ref 30.0–36.0)
MCV: 84.1 fL (ref 80.0–100.0)
Monocytes Absolute: 0.6 10*3/uL (ref 0.1–1.0)
Monocytes Relative: 7 %
Neutro Abs: 6.4 10*3/uL (ref 1.7–7.7)
Neutrophils Relative %: 71 %
Platelets: 476 10*3/uL — ABNORMAL HIGH (ref 150–400)
RBC: 3.95 MIL/uL (ref 3.87–5.11)
RDW: 15.5 % (ref 11.5–15.5)
WBC: 9 10*3/uL (ref 4.0–10.5)
nRBC: 0.2 % (ref 0.0–0.2)

## 2021-10-29 LAB — HEPATIC FUNCTION PANEL
ALT: 20 U/L (ref 0–44)
AST: 19 U/L (ref 15–41)
Albumin: 3.1 g/dL — ABNORMAL LOW (ref 3.5–5.0)
Alkaline Phosphatase: 66 U/L (ref 38–126)
Bilirubin, Direct: 0.1 mg/dL (ref 0.0–0.2)
Indirect Bilirubin: 0.4 mg/dL (ref 0.3–0.9)
Total Bilirubin: 0.5 mg/dL (ref 0.3–1.2)
Total Protein: 7 g/dL (ref 6.5–8.1)

## 2021-10-29 LAB — BASIC METABOLIC PANEL
Anion gap: 9 (ref 5–15)
BUN: 6 mg/dL (ref 6–20)
CO2: 33 mmol/L — ABNORMAL HIGH (ref 22–32)
Calcium: 9.1 mg/dL (ref 8.9–10.3)
Chloride: 97 mmol/L — ABNORMAL LOW (ref 98–111)
Creatinine, Ser: 0.96 mg/dL (ref 0.44–1.00)
GFR, Estimated: >60 mL/min (ref >60)
Glucose, Bld: 123 mg/dL — ABNORMAL HIGH (ref 70–99)
Potassium: 3.3 mmol/L — ABNORMAL LOW (ref 3.5–5.1)
Sodium: 139 mmol/L (ref 135–145)

## 2021-10-29 LAB — TSH: TSH: 0.905 u[IU]/mL (ref 0.350–4.500)

## 2021-10-29 LAB — HIV ANTIBODY (ROUTINE TESTING W REFLEX): HIV Screen 4th Generation wRfx: NONREACTIVE

## 2021-10-29 LAB — MAGNESIUM: Magnesium: 2.1 mg/dL (ref 1.7–2.4)

## 2021-10-29 MED ORDER — POTASSIUM CHLORIDE 10 MEQ/100ML IV SOLN
10.0000 meq | INTRAVENOUS | Status: AC
  Administered 2021-10-29 (×4): 10 meq via INTRAVENOUS
  Filled 2021-10-29 (×4): qty 100

## 2021-10-29 MED ORDER — POTASSIUM CHLORIDE CRYS ER 20 MEQ PO TBCR
40.0000 meq | EXTENDED_RELEASE_TABLET | Freq: Two times a day (BID) | ORAL | Status: AC
  Administered 2021-10-29 (×2): 40 meq via ORAL
  Filled 2021-10-29 (×2): qty 2

## 2021-10-29 NOTE — Progress Notes (Signed)
Patient states she has had "at least" 2 unmeasurable episodes of large urine output last night.  Also had a large unmeasurable episode of urine output this a.m.

## 2021-10-29 NOTE — Progress Notes (Signed)
Mobility Specialist Progress Note:   10/29/21 1252  Mobility  Activity Ambulated in hall  Level of Assistance Independent  Assistive Device None  Distance Ambulated (ft) 300 ft  Mobility Ambulated with assistance in hallway  Mobility Response Tolerated well  Mobility performed by Mobility specialist  $Mobility charge 1 Mobility   Pt received in bed willing to participate in mobility. No complaints of pain. Pt SOB at end of ambulation. Pt left in bed with call bell in reach and all needs met.   [facility] Public librarian Phone [phone] Secondary Phone [phone]

## 2021-10-29 NOTE — Progress Notes (Addendum)
Subjective:  Overnight Events: No acute events or concerns overnight  Patient was seen and examined on rounds.  She reports improvement in SOB.  No longer has SOB at rest.  Still has some dyspnea on exertion when ambulating to the restroom or bedside commode.  Reports frequent urine output.  Had incontinence with unmeasured urine output on pure wick, now has bedside commode set up to collect and calculate urine output.  Reports persistent swelling of bilateral lower extremities.  Objective:  Vital signs in last 24 hours: Vitals:   10/28/21 2314 10/29/21 0253 10/29/21 0505 10/29/21 0758  BP: (!) 150/59  138/72 (!) 142/66  Pulse: (!) 58  (!) 56 66  Resp: (!) 22  20 18   Temp: 98.2 F (36.8 C)  98.9 F (37.2 C) 97.9 F (36.6 C)  TempSrc: Oral  Oral Oral  SpO2: 98%  97% 96%  Weight:  (!) 163.1 kg    Height:        No intake or output data in the 24 hours ending 10/29/21 1058  Physical Exam: General: Well appearing obese Caucasian female, NAD HENT: normocephalic, atraumatic EYES: conjunctiva non-erythematous, no scleral icterus CV: Bradycardic, normal rhythm, no murmurs, rubs, gallops.  2+ pitting edema bilateral lower extremities. Pulmonary: normal work of breathing on RA, lungs clear to auscultation, no rales or decreased breath sounds appreciated Abdominal: non-distended, soft, non-tender to palpation, normal BS Skin: Warm and dry, no rashes or lesions Neurological: MS: awake, alert and oriented x3, normal speech and fund of knowledge Motor: moves all extremities antigravity Psych: normal affect   Assessment/Plan:  Principal Problem:   Acute heart failure [facility])  Mrs. Nancy Wu is a 55 y/o female with a PMHx of A. Fib on Eliquis and Flecainide, HTN, OSA, depression, sciatica and recent appendicitis s/p appendectomy who presents to the ED with c/o SOB with clinical presentation consistent with acute heart failure, now admitted for IV diuresis.  #Acute hypoxic  respiratory failure, resolved #Acute Heart Failure 2/2 fluid resuscitation during prior hospitalization Patient had recent hospitalization at [facility] for sepsis secondary to perforated appendicitis requiring appendectomy and ICU stay.  During the stay patient received fluid resuscitation and IV antibiotics.  Patient does not have prior history of heart failure.  BNP on arrival this admission to 248.  Low suspicion for ACS given flat troponins and no evidence of ischemia on EKG as cause of acute heart failure.  Echo completed on admission with normal EF notable only for mild mitral regurgitation.  Patient likely has acute HFpEF as a result of fluid resuscitation during prior hospitalization.  Mildly low albumin 3.1 may be slightly contributing to hypervolemia.  Patient reports ~16 pound weight gain since prior admission to [facility]. Currently still hypervolemic on exam, though with interim improvement in respiratory status, now on room air. Plan: -Lasix 40 mg IV twice daily -Unna boots -Continue supplemental oxygen to maintain oxygen saturation above 88% as needed -Strict I's and O's -Daily weights  #Acute normocytic anemia likely secondary to recent surgery Hemoglobin on arrival 10.2, now improving to 10.5.  No history of normocytic anemia, suspect this is likely from blood loss due to recent surgery for appendectomy. Plan: -Continue to monitor hemoglobin  #Hypokalemia Potassium 3.3 on 12/2 in the setting of IV Lasix for diuresis.  Normal magnesium 2.1 Plan: -Repletion of electrolytes as needed -Continue to monitor BMP  #Hypoalbuminemia Albumin low this admission 3.1 with normal liver function studies.  Albumin normal prior to ICU stay for sepsis  secondary to perforated appendicitis.  Low albumin likely in the setting of recent severe illness.  Expect this will improve over time.  #Paroxysmal Atrial Fibrillation  Patient has a long-term history of paroxysmal atrial  fibrillation currently being managed with Eliquis, metoprolol and Flecainide. Patient predominantly bradycardic with heart rate in the 50s to 60s and EKG showing sinus bradycardia with borderline first-degree AV block.  Plan: -Continue home Eliquis -Continue home flecainide -Holding metoprolol in the setting of bradycardia  #Hypertension Patient mildly hypertensive on admission.  Was restarted on home blood pressure medications.  Has been normotensive this admission. Plan: -Lisinopril 20mg  and HCTZ 12.5mg  daily  #Obstructive Sleep Apnea -CPAP nightly  #Recent perforated appendicitis status post appendectomy Patient was discharged from recent hospitalization on Augmentin for total 10-day course.  Continued on admission.  Surgical incision site intact without signs of infection. Plan: -Augmentin 875-125 mg twice daily, last day 12/3  #Depression Continued on home Lexapro 20 mg daily this admission.  #History of sciatica Patient was continued on home gabapentin 800 mg 3 times daily  Diet: Heart healthy VTE: Eliquis IVF: none Code: Full  Prior to Admission Living Arrangement: Home Anticipated Discharge Location: Home Barriers to Discharge: Medical management of heart failure exacerbation with IV diuresis Dispo: Anticipated discharge tomorrow   Portions of this report may have been transcribed using voice recognition software. Every effort was made to ensure accuracy; however, inadvertent computerized transcription errors may be present.   14/3, MD 10/29/21,  10:58 AM Pager: [phone] Internal Medicine Resident, PGY-1 [phone] Internal Medicine

## 2021-10-30 DIAGNOSIS — I10 Essential (primary) hypertension: Secondary | ICD-10-CM

## 2021-10-30 DIAGNOSIS — R0602 Shortness of breath: Secondary | ICD-10-CM | POA: Diagnosis not present

## 2021-10-30 LAB — BASIC METABOLIC PANEL
Anion gap: 12 (ref 5–15)
BUN: 8 mg/dL (ref 6–20)
CO2: 32 mmol/L (ref 22–32)
Calcium: 9.2 mg/dL (ref 8.9–10.3)
Chloride: 95 mmol/L — ABNORMAL LOW (ref 98–111)
Creatinine, Ser: 1.23 mg/dL — ABNORMAL HIGH (ref 0.44–1.00)
GFR, Estimated: 52 mL/min — ABNORMAL LOW (ref >60)
Glucose, Bld: 119 mg/dL — ABNORMAL HIGH (ref 70–99)
Potassium: 4.3 mmol/L (ref 3.5–5.1)
Sodium: 139 mmol/L (ref 135–145)

## 2021-10-30 LAB — CBC
HCT: 37.3 % (ref 36.0–46.0)
Hemoglobin: 11.3 g/dL — ABNORMAL LOW (ref 12.0–15.0)
MCH: 25.9 pg — ABNORMAL LOW (ref 26.0–34.0)
MCHC: 30.3 g/dL (ref 30.0–36.0)
MCV: 85.6 fL (ref 80.0–100.0)
Platelets: 461 10*3/uL — ABNORMAL HIGH (ref 150–400)
RBC: 4.36 MIL/uL (ref 3.87–5.11)
RDW: 15.6 % — ABNORMAL HIGH (ref 11.5–15.5)
WBC: 8.3 10*3/uL (ref 4.0–10.5)
nRBC: 0 % (ref 0.0–0.2)

## 2021-10-30 NOTE — Progress Notes (Signed)
SATURATION QUALIFICATIONS: (This note is used to comply with regulatory documentation for home oxygen)  Patient Saturations on Room Air at Rest = 94%  Patient Saturations on Room Air while Ambulating = 90%    Patient O2 sats did drop to 88% but rebounded back up to 93% in less than 5 seconds without stopping ambulating

## 2021-10-30 NOTE — Discharge Summary (Signed)
Name: Nancy Wu MRN: [id number] DOB: 1966-03-31 55 y.o. PCP: [facility]  Date of Admission: 10/27/2021  6:45 PM Date of Discharge: 10/30/2021 Attending Physician: Lucious Groves, DO  Discharge Diagnosis: 1. Acute Heart Failure 2/2 iatrogenic volume overload 2. AKI 2/2 overdiuresis  Discharge Medications: Allergies as of 10/30/2021       Reactions   Codeine Nausea Only   REACTION: sick to stomach        Medication List     STOP taking these medications    amoxicillin-clavulanate 875-125 MG tablet Commonly known as: AUGMENTIN   metoprolol succinate 25 MG 24 hr tablet Commonly known as: Toprol XL       TAKE these medications    Eliquis 5 MG Tabs tablet Generic drug: apixaban TAKE 1 TABLET BY MOUTH TWICE A DAY   escitalopram 20 MG tablet Commonly known as: LEXAPRO Take 20 mg by mouth daily.   flecainide 100 MG tablet Commonly known as: TAMBOCOR Take 1 tablet (100 mg total) by mouth 2 (two) times daily.   gabapentin 800 MG tablet Commonly known as: NEURONTIN Take 800 mg by mouth 3 (three) times daily.   lisinopril-hydrochlorothiazide 20-12.5 MG tablet Commonly known as: ZESTORETIC Take 1 tablet by mouth daily.   oxyCODONE 5 MG immediate release tablet Commonly known as: Oxy IR/ROXICODONE Take 5 mg by mouth every 4 (four) hours as needed.   pantoprazole 40 MG tablet Commonly known as: PROTONIX Take 1 tablet by mouth daily.        Disposition and follow-up:   Nancy Wu was discharged from [facility] in Stable condition.  At the hospital follow up visit please address:  1.  Acute HF 2/2 iatrogenic fluid resuscitation during prior hospitalization at [facility]. Responded well to IV lasix. ECHO shows no abnormalities (EF 08-65%, no systolic or diastolic dysfunction, no RWMA). Volume status greatly improved, will not discharge with lasix.   AKI 2/2 overdiuresis. Kidney function was stable until date  of discharge. Cr increased from 0.96 > 1.23, likely from IV lasix. Anticipate this will improve with PO intake. Please evaluate BMP at hospital f/u visit.  2.  Labs / imaging needed at time of follow-up: BMP  3.  Pending labs/ test needing follow-up: none  Follow-up Appointments:  Follow-up Information     Physicians, [facility]. Schedule an appointment as soon as possible for a visit in 4 day(s).   Specialty: Family Medicine Contact information: [place] [address] [phone]         Sueanne Margarita, MD. Schedule an appointment as soon as possible for a visit in 1 week(s).   Specialty: Cardiology Contact information: [address] [place] [address] [phone]                 Hospital Course by problem list: Mrs. Nancy Wu is a 55 y/o female with a PMHx of A. Fib on Eliquis and Flecainide, HTN, OSA, depression, sciatica and recent appendicitis s/p appendectomy who presents to the ED with c/o SOB with clinical presentation consistent with acute heart failure likely secondary from fluid resuscitation from prior hospitalization, admitted for IV diuresis.  #Acute hypoxic respiratory failure, resolved #Acute Heart Failure 2/2 iatrogenic fluid resuscitation during prior hospitalization Patient had recent hospitalization at [facility] for sepsis secondary to perforated appendicitis requiring appendectomy and ICU stay.  During the stay patient received fluid resuscitation and IV antibiotics.  Patient does not have prior history  of heart failure.  BNP on arrival this admission to 248.  Low suspicion for ACS given flat troponins and no evidence of ischemia on EKG as cause of acute heart failure.  Echo completed on admission with normal EF notable only for mild mitral regurgitation.  Clinical presentation was consistent with acute HFpEF as a result of fluid resuscitation during prior hospitalization.  Mildly low albumin 3.1  may be slightly contributing to hypervolemia. Patient reports ~16 pound weight gain since prior admission to [facility].  Suspected dry weight 156kg.  Patient was diuresed with IV Lasix 40 mg twice daily with good urine output.  Patient is now 159.3 kilograms at discharge. Ambulatory O2 sats >90%. Patient needs follow-up with PCP for reevaluation of volume status after discharge to ensure episode of acute heart failure has resolved.  #Acute normocytic anemia likely secondary to recent surgery Hemoglobin on arrival 10.2, improved to 11.3 at discharge.  No history of normocytic anemia, suspect this is likely from blood loss due to recent surgery for appendectomy.  Expect this to improve over time.  Patient should follow-up with PCP to recheck CBC to ensure hemoglobin continues to improve.  #Hypokalemia Potassium 3.3 on 12/2 in the setting of IV Lasix for diuresis.  Normal magnesium 2.1 this admission.  Electrolytes were repleted as needed.  Would suggest to check BMP at hospital follow-up.   #Hypoalbuminemia Albumin low this admission 3.1 with normal liver function studies.  Albumin normal prior to ICU stay for sepsis secondary to perforated appendicitis. Low albumin likely in the setting of recent severe illness. Expect this will improve over time.  No further medical management.  #Paroxysmal Atrial Fibrillation  Patient has a long-term history of paroxysmal atrial fibrillation currently being managed with Eliquis, metoprolol and Flecainide. Patient predominantly bradycardic with heart rate in the 50s to 60s this admission and EKG showing sinus bradycardia with borderline first-degree AV block.  Patient was continued on home medications Eliquis and flecainide, though metoprolol was held in the setting of bradycardia.  At discharge patient was continued on eliquis and flecainide. Metoprolol has been held and patient instructed to f/u with PCP and cardiologist in outpatient setting prior to  restarting.  #Hypertension Patient mildly hypertensive on admission.  Was restarted on home blood pressure medications.  Remained normotensive this admission.  Was discharged on home medications.  #Obstructive Sleep Apnea Patient had order for CPAP to be placed nightly during admission.  #Recent perforated appendicitis status post appendectomy Patient was discharged from recent hospitalization on Augmentin for total 10-day course.  Continued on admission.  Surgical incision site intact without signs of infection.  Patient will complete Augmentin 875-125 mg twice daily with last day 12/3.  #Depression Continued on home Lexapro 20 mg daily this admission.   #History of sciatica Patient was continued on home gabapentin 800 mg 3 times daily  Discharge Exam:   BP 123/67 (BP Location: Right Wrist)   Pulse 63   Temp 98.6 F (37 C) (Oral)   Resp 19   Ht 5' 3"  (1.6 m)   Wt (!) 159.3 kg   SpO2 96%   BMI 62.19 kg/m  Discharge exam: General: morbidly obese, pleasant female, lying in bed, NAD. CV: normal rate and regular rhythm, no m/r/g. Pulm: difficult to assess due to body habitus, but no overt adventitious sounds noted bilaterally. MSK: no peripheral edema noted. Skin: warm and dry. Neuro: AAOx3, no focal deficits. Psych: calm and cooperative.  Pertinent Labs, Studies, and Procedures:  BMP Latest Ref  Rng & Units 10/30/2021 10/29/2021 10/27/2021  Glucose 70 - 99 mg/dL 119(H) 123(H) 114(H)  BUN 6 - 20 mg/dL 8 6 7   Creatinine 0.44 - 1.00 mg/dL 1.23(H) 0.96 1.01(H)  Sodium 135 - 145 mmol/L 139 139 140  Potassium 3.5 - 5.1 mmol/L 4.3 3.3(L) 3.9  Chloride 98 - 111 mmol/L 95(L) 97(L) 99  CO2 22 - 32 mmol/L 32 33(H) 31  Calcium 8.9 - 10.3 mg/dL 9.2 9.1 8.9   CBC Latest Ref Rng & Units 10/30/2021 10/29/2021 10/27/2021  WBC 4.0 - 10.5 K/uL 8.3 9.0 9.7  Hemoglobin 12.0 - 15.0 g/dL 11.3(L) 10.5(L) 10.2(L)  Hematocrit 36.0 - 46.0 % 37.3 33.2(L) 33.8(L)  Platelets 150 - 400 K/uL 461(H)  476(H) 439(H)   Lab Results  Component Value Date   TSH 0.905 10/29/2021   Hepatic Function Latest Ref Rng & Units 10/29/2021 05/08/2013 02/23/2012  Total Protein 6.5 - 8.1 g/dL 7.0 7.1 6.8  Albumin 3.5 - 5.0 g/dL 3.1(L) 4.1 4.3  AST 15 - 41 U/L 19 16 15   ALT 0 - 44 U/L 20 23 17   Alk Phosphatase 38 - 126 U/L 66 53 49  Total Bilirubin 0.3 - 1.2 mg/dL 0.5 0.3 0.2(L)  Bilirubin, Direct 0.0 - 0.2 mg/dL 0.1 - -   BNP    Component Value Date/Time   BNP 248.9 (H) 10/27/2021 1932     Discharge Instructions: Discharge Instructions     (HEART FAILURE PATIENTS) Call MD:  Anytime you have any of the following symptoms: 1) 3 pound weight gain in 24 hours or 5 pounds in 1 week 2) shortness of breath, with or without a dry hacking cough 3) swelling in the hands, feet or stomach 4) if you have to sleep on extra pillows at night in order to breathe.   Complete by: As directed    Diet - low sodium heart healthy   Complete by: As directed    Discharge instructions   Complete by: As directed    Mrs. Crotwell, it was a pleasure taking care of you during your time here. You came in because you had a lot of fluid in your body and we gave you a water pill to help remove that fluid.  Please make sure to do the following: 1. Please do NOT take your metoprolol at home until you see your primary care doctor and your heart doctor. This is being held because your heart rate was slower than normal during your hospitalization. 2. Please make sure to schedule a follow up visit with your Primary Care doctor at [place] for this Thursday. They will help you take your UNNA boots off as well (the dressings on your legs). 3. Please make sure to schedule a follow up visit with your heart doctor in 1 to 2 weeks.   Increase activity slowly   Complete by: As directed        Signed: Virl Axe, MD 10/30/2021, 3:15 PM   Pager: [phone]

## 2021-11-09 ENCOUNTER — Ambulatory Visit: Payer: 59 | Admitting: Cardiology

## 2021-12-01 ENCOUNTER — Encounter: Payer: Self-pay | Admitting: Cardiology

## 2021-12-01 ENCOUNTER — Ambulatory Visit (INDEPENDENT_AMBULATORY_CARE_PROVIDER_SITE_OTHER): Payer: Managed Care, Other (non HMO) | Admitting: Cardiology

## 2021-12-01 ENCOUNTER — Other Ambulatory Visit: Payer: Self-pay

## 2021-12-01 VITALS — BP 142/79 | HR 64 | Ht 63.0 in | Wt 365.1 lb

## 2021-12-01 DIAGNOSIS — Z7901 Long term (current) use of anticoagulants: Secondary | ICD-10-CM | POA: Diagnosis not present

## 2021-12-01 DIAGNOSIS — I480 Paroxysmal atrial fibrillation: Secondary | ICD-10-CM

## 2021-12-01 DIAGNOSIS — Z79899 Other long term (current) drug therapy: Secondary | ICD-10-CM | POA: Diagnosis not present

## 2021-12-01 DIAGNOSIS — I5032 Chronic diastolic (congestive) heart failure: Secondary | ICD-10-CM

## 2021-12-01 DIAGNOSIS — I110 Hypertensive heart disease with heart failure: Secondary | ICD-10-CM | POA: Diagnosis not present

## 2021-12-01 MED ORDER — FUROSEMIDE 40 MG PO TABS: 40.0000 mg | ORAL_TABLET | Freq: Every day | ORAL | 3 refills | Status: DC

## 2021-12-01 MED ORDER — METOPROLOL SUCCINATE ER 25 MG PO TB24: 12.5000 mg | ORAL_TABLET | Freq: Every day | ORAL | 3 refills | Status: DC

## 2021-12-01 MED ORDER — DRONEDARONE HCL 400 MG PO TABS
400.0000 mg | ORAL_TABLET | Freq: Two times a day (BID) | ORAL | 3 refills | Status: DC
Start: 2021-12-01 — End: 2023-03-22

## 2021-12-01 NOTE — Progress Notes (Signed)
Cardiology Office Note:    Date:  12/01/2021   ID:  Nancy Wu, DOB Apr 28, 1966, MRN [id number]  PCP:  [facility]  Cardiologist:  Norman HerrlichBrian Sutton Plake, MD    Referring MD: [facility]    ASSESSMENT:    1. Hypertensive heart disease with chronic diastolic congestive heart failure (HCC)   2. Paroxysmal atrial fibrillation (HCC)   3. Chronic anticoagulation   4. High risk medication use    PLAN:    In order of problems listed above:  She has new onset heart failure precipitated by fluid loading acute medical illness but also in the setting of flecainide the drug is contraindicated needs to be stopped as of now.  Previously she tolerated Multaq we will wait and we can place her on the drug.  I will place her back on loop diuretic 2 weeks should be seen in our office by whichever doctor is here needing renal function checked and EKG.  We can also address the issue of SGLT2 inhibitor. See above discontinue flecainide washout 1 weeks right Multaq follow-up in the office will need an EKG and baseline labs Continue her current anticoagulant She has developed heart failure with flecainide discontinued now contraindicated   Next appointment: 2 weeks   Medication Adjustments/Labs and Tests Ordered: Current medicines are reviewed at length with the patient today.  Concerns regarding medicines are outlined above.  No orders of the defined types were placed in this encounter.  Meds ordered this encounter  Medications   metoprolol succinate (TOPROL-XL) 25 MG 24 hr tablet    Sig: Take 0.5 tablets (12.5 mg total) by mouth daily.    Dispense:  45 tablet    Refill:  3   furosemide (LASIX) 40 MG tablet    Sig: Take 1 tablet (40 mg total) by mouth daily.    Dispense:  90 tablet    Refill:  3   dronedarone (MULTAQ) 400 MG tablet    Sig: Take 1 tablet (400 mg total) by mouth 2 (two) times daily with a meal.    Dispense:  180 tablet    Refill:  3    Chief  complaint recently discharged from the hospital with new onset heart failure   History of Present Illness:    Nancy Reiningamela Busch is a 56 y.o. female with a hx of normal coronary arteriography in 2017 morbid obesity paroxysmal atrial fibrillation suppressed with flecainide and anticoagulated with Eliquis sleep apnea last seen by my partner Dr. Servando Salinaobb 05/24/2021.  She was seen by EP Dr. Elberta Fortisamnitz 09/06/2021 with breakthrough episodes of atrial fibrillation and increase in her dose of flecainide.  Compliance with diet, lifestyle and medications: Yes  I reviewed her medical records with him she was not seen by cardiology during hospitalization She was not discharged on loop diuretic her weight is up to 6 to 7 pounds from baseline more edema more exertional shortness of breath and having 3 pillow orthopnea she is worried about recurrent heart failure She continues on flecainide  [facility] was [facility] 10/27/2021 discharged 10/30/2021 with admission diagnosis of acute heart failure echocardiogram showed continued preserved ejection fraction 55 to 60% heart failure is attributed to IV fluid loading during private hospitalization at [facility] and subsequently had acute deterioration in kidney function related to diuresis.  She had recently been admitted to [facility] with appendicitis had appendectomy and presented to the emergency room with acute decompensated heart failure she was also anemic with a  hemoglobin of 10.2 hypokalemic potassium 3.3 albumin is diminished at 3.1 and she maintains sinus rhythm through the hospitalization.  Echo 10/28/2021:  1. Left ventricular ejection fraction, by estimation, is 55 to 60%. The  left ventricle has normal function. The left ventricle has no regional  wall motion abnormalities. Left ventricular diastolic parameters were  normal.   2. Right ventricular systolic function is normal. The right ventricular  size is normal.   3. The mitral  valve is normal in structure. Mild mitral valve  regurgitation. No evidence of mitral stenosis.   4. The aortic valve was not well visualized. Aortic valve regurgitation  is not visualized. No aortic stenosis is present.   She had a myocardial perfusion study at [facility] 07/26/2021 showing normal perfusion EF 59% low risk test  In hospital BNP level was modestly elevated Component Ref Range & Units 1 mo ago   B Natriuretic Peptide 0.0 - 100.0 pg/mL 248.9 High    CT of the chest 10/28/2021 showed no findings of pulmonary embolism lung appearance was consistent with heart failure and mild pulmonary edema  Past Medical History:  Diagnosis Date   Acute pharyngitis due to other specified organisms 10/14/2020   ALLERGIC RHINITIS 08/20/2007   Qualifier: Diagnosis of  By: Daphine Deutscher FNP, [place]     Anxiety    Borderline diabetes    Chest pain 05/08/2013   Normal coronary arteriography in November 2017   Chronic anticoagulation 09/26/2017   Chronic low back pain 01/25/2007   Qualifier: Diagnosis of  By: Bradly Bienenstock     Depression    Dyspnea 02/12/2021   Essential hypertension 01/25/2007   Qualifier: Diagnosis of  By: Bradly Bienenstock     Gastroesophageal reflux disease without esophagitis 09/29/2016   Heart disease    afib   High risk medication use 11/02/2016   Overview:  Multaq   HYPERTENSION, BENIGN SYSTEMIC 01/25/2007   Qualifier: Diagnosis of  By: Bradly Bienenstock     Hypertensive heart disease without heart failure 09/29/2016   Ingrown toenail 10/21/2012   IUD 2007   Lumbago with sciatica of left side    Migraine 07/22/2011   Morbid obesity (HCC)    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HCC) 11/14/2017   Obstructive sleep apnea treated with continuous positive airway pressure (CPAP) 02/12/2018   Palpitations 02/12/2021   Paroxysmal atrial fibrillation (HCC) 09/29/2016   PRESENCE OF IUD 11/28/2004   Qualifier: Diagnosis of  By: Daphine Deutscher  FNP, Nykedtra     Reactive depression 01/25/2007   Qualifier: Diagnosis of  By: Bradly Bienenstock     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   Right knee meniscal tear 12/15/2011   Sciatica 01/25/2007   Qualifier: Diagnosis of  By: Bradly Bienenstock     Strain of left hamstring 06/01/2019    Past Surgical History:  Procedure Laterality Date   APPENDECTOMY     CARDIAC CATHETERIZATION Bilateral 09/29/2016   [place]   CESAREAN SECTION  1989   CHOLECYSTECTOMY  2010   KNEE SURGERY Bilateral    TUBAL LIGATION  1989    Current Medications: Current Meds  Medication Sig   apixaban (ELIQUIS) 5 MG TABS tablet TAKE 1 TABLET BY MOUTH TWICE A DAY   dronedarone (MULTAQ) 400 MG tablet Take 1 tablet (400 mg total) by mouth 2 (two) times daily with a meal.   escitalopram (LEXAPRO) 20 MG tablet Take 20 mg by mouth daily.   furosemide (LASIX) 40  MG tablet Take 1 tablet (40 mg total) by mouth daily.   gabapentin (NEURONTIN) 800 MG tablet Take 800 mg by mouth 3 (three) times daily.   lisinopril-hydrochlorothiazide (ZESTORETIC) 20-12.5 MG tablet Take 1 tablet by mouth daily.   oxyCODONE (OXY IR/ROXICODONE) 5 MG immediate release tablet Take 5 mg by mouth every 4 (four) hours as needed.   pantoprazole (PROTONIX) 40 MG tablet Take 1 tablet by mouth daily.   [DISCONTINUED] flecainide (TAMBOCOR) 100 MG tablet Take 1 tablet (100 mg total) by mouth 2 (two) times daily.   [DISCONTINUED] metoprolol succinate (TOPROL-XL) 25 MG 24 hr tablet Take 12.5 mg by mouth daily.     Allergies:   Codeine   Social History   Socioeconomic History   Marital status: Married    Spouse name: Not on file   Number of children: Not on file   Years of education: Not on file   Highest education level: Not on file  Occupational History   Not on file  Tobacco Use   Smoking status: Former   Smokeless tobacco: Never  Substance and Sexual Activity   Alcohol use: Yes    Comment: Occasional.   Drug use: No   Sexual  activity: Not Currently    Partners: Male    Birth control/protection: I.U.D.  Other Topics Concern   Not on file  Social History Narrative   Not on file   Social Determinants of Health   Financial Resource Strain: Not on file  Food Insecurity: Not on file  Transportation Needs: Not on file  Physical Activity: Not on file  Stress: Not on file  Social Connections: Not on file     Family History: The patient's family history includes Alcohol abuse in her brother; Cancer in an other family member; Depression in her mother and sister; Diabetes in an other family member; Heart disease in an other family member; Hypertension in her father, mother, and another family member; Lymphoma in her father; Melanoma in her father; Stroke in her father and another family member. ROS:   Please see the history of present illness.    All other systems reviewed and are negative.  EKGs/Labs/Other Studies Reviewed:    The following studies were reviewed today:    Recent Labs: 10/27/2021: B Natriuretic Peptide 248.9 10/29/2021: ALT 20; Magnesium 2.1; TSH 0.905 10/30/2021: BUN 8; Creatinine, Ser 1.23; Hemoglobin 11.3; Platelets 461; Potassium 4.3; Sodium 139  Recent Lipid Panel    Component Value Date/Time   CHOL 155 04/22/2011 0900   TRIG 112 04/22/2011 0900   HDL 45 04/22/2011 0900   CHOLHDL 3.4 04/22/2011 0900   VLDL 22 04/22/2011 0900   LDLCALC 88 04/22/2011 0900   LDLDIRECT 80 05/08/2013 1636    Physical Exam:    VS:  BP (!) 142/79 (BP Location: Right Arm, Patient Position: Sitting, Cuff Size: Normal)    Pulse 64    Ht 5\' 3"  (1.6 m)    Wt (!) 365 lb 1.9 oz (165.6 kg)    SpO2 96%    BMI 64.68 kg/m     Wt Readings from Last 3 Encounters:  12/01/21 (!) 365 lb 1.9 oz (165.6 kg)  10/30/21 (!) 351 lb 1.6 oz (159.3 kg)  09/20/21 (!) 360 lb (163.3 kg)     GEN:  Well nourished, well developed in no acute distress HEENT: Normal NECK: No JVD; No carotid bruits LYMPHATICS: No  lymphadenopathy CARDIAC: RRR, no murmurs, rubs, gallops RESPIRATORY:  Clear to auscultation without rales, wheezing or  rhonchi  ABDOMEN: Soft, non-tender, non-distended MUSCULOSKELETAL: 3-4+ lower extremity bilateral pitting edema; No deformity  SKIN: Warm and dry NEUROLOGIC:  Alert and oriented x 3 PSYCHIATRIC:  Normal affect    Signed, Norman HerrlichBrian Bethanne Mule, MD  12/01/2021 6:04 PM    [facility]

## 2021-12-01 NOTE — Patient Instructions (Signed)
Medication Instructions:  Your physician has recommended you make the following change in your medication:  START: Furosemide 40 mg take one tablet by mouth daily.  RESTART METOPROLOL  Wait one week and then start multaq 400 mg take one tablet by mouth twice daily.  *If you need a refill on your cardiac medications before your next appointment, please call your pharmacy*   Lab Work: None If you have labs (blood work) drawn today and your tests are completely normal, you will receive your results only by: MyChart Message (if you have MyChart) OR A paper copy in the mail If you have any lab test that is abnormal or we need to change your treatment, we will call you to review the results.   Testing/Procedures: None   Follow-Up: At [facility], you and your health needs are our priority.  As part of our continuing mission to provide you with exceptional heart care, we have created designated Provider Care Teams.  These Care Teams include your primary Cardiologist (physician) and Advanced Practice Providers (APPs -  Physician Assistants and Nurse Practitioners) who all work together to provide you with the care you need, when you need it.  We recommend signing up for the patient portal called "MyChart".  Sign up information is provided on this After Visit Summary.  MyChart is used to connect with patients for Virtual Visits (Telemedicine).  Patients are able to view lab/test results, encounter notes, upcoming appointments, etc.  Non-urgent messages can be sent to your provider as well.   To learn more about what you can do with MyChart, go to [email or web address].    Your next appointment:   Already scheduled  The format for your next appointment:   In Person  Provider:   Norman Herrlich, MD    Other Instructions

## 2021-12-02 ENCOUNTER — Encounter: Payer: Self-pay | Admitting: Neurology

## 2021-12-02 NOTE — Addendum Note (Signed)
Addended by: Smiley Houseman B on: 12/02/2021 01:25 PM   Modules accepted: Orders

## 2021-12-03 ENCOUNTER — Telehealth: Payer: Self-pay | Admitting: Cardiology

## 2021-12-03 ENCOUNTER — Ambulatory Visit: Payer: Managed Care, Other (non HMO) | Admitting: Neurology

## 2021-12-03 ENCOUNTER — Encounter: Payer: Self-pay | Admitting: Neurology

## 2021-12-03 VITALS — BP 149/76 | HR 74 | Ht 63.0 in | Wt 369.0 lb

## 2021-12-03 DIAGNOSIS — I480 Paroxysmal atrial fibrillation: Secondary | ICD-10-CM | POA: Diagnosis not present

## 2021-12-03 DIAGNOSIS — R0601 Orthopnea: Secondary | ICD-10-CM | POA: Diagnosis not present

## 2021-12-03 DIAGNOSIS — E669 Obesity, unspecified: Secondary | ICD-10-CM

## 2021-12-03 DIAGNOSIS — S7421XA Injury of cutaneous sensory nerve at hip and high level, right leg, initial encounter: Secondary | ICD-10-CM

## 2021-12-03 DIAGNOSIS — Z9989 Dependence on other enabling machines and devices: Secondary | ICD-10-CM | POA: Diagnosis not present

## 2021-12-03 DIAGNOSIS — I5033 Acute on chronic diastolic (congestive) heart failure: Secondary | ICD-10-CM

## 2021-12-03 NOTE — Telephone Encounter (Signed)
PA started on CMM for Multaq 400 mg.

## 2021-12-03 NOTE — Telephone Encounter (Signed)
[facility] called stating they sent a prior-auth request yesterday for dronedarone (MULTAQ) 400 MG tablet. They want to make sure it was received.

## 2021-12-03 NOTE — Progress Notes (Signed)
SLEEP MEDICINE CLINIC   Provider:  Larey Wu, M Wu  Primary Care Physician:  [facility]   Referring Provider: Serita Grammes, MD   Nancy Lipa, PA.   Chief Complaint   alone. Paper referral from Nancy Grammes, MD for right leg numbness ( and send to Dr Bowman Higbie/ Sleep and EEG MD ?) . Had appendicitis end of Nov. Had surgery. Day after surgery, numb started. Hospital did CT scan. Report in paper referral. Numb constant.      Other Originally followed w/ Dr. Brett Wu for CPAP. She currently still uses CPAP as ordered by dr Nancy Fairy, MD .  Heart doctor wants her to have another sleep study .     Patient presents with                  HPI:  Nancy Wu is a 56 y.o. female , seen here as in a referral  from PA at [facility] for an evaluation of left thigh numbness following appendectomy in November at [facility],  she was released on a Wednesday and returned the following Wednesday, 7 days later on 10-27-2021 ( just a bad Thanksgiving)  to [facility] with severe SOB and ankle edema, was diagnosed with acute CHF.  She was in atrial fibrillation, felt impaired, fatigued, and scared by palpitations,  dizziness, lightheadedness. Paroxysmal atrial fib with medication rate  control. On Toprol. She as on flecainide but with CHF this was just Wu/c on Wednesday 12-01-2021.  She contracted influenza in August 2022 and Covid in September 2022. Atrial fibrillation was worsening by Covid, lasting 12-18 hours , which was new. She had atrial fibrillation before - but it would last only a shot while, never hours. She gained fluid weight since.  CT chest :  Quote  ED admission note Dr Nancy Colander, DO: As noted is a 56 year old female with past medical history of A. fib, hypertension, OSA with recent history of perforated appendicitis causing sepsis with ICU hospitalization at [facility].  Following that hospitalization she noted a 12 pound weight  gain and increased dyspnea.  She has been adherent to her home medications.  In the emergency department work-up revealed an elevated BNP CT angiogram ruled out pulmonary embolism but did show pulmonary edema.  She was treated with IV Lasix and admitted for an acute heart failure exacerbation.  We have obtained an echocardiogram which shows preserved ejection fraction.  Overall I suspect that her heart failure exacerbation was caused by the appropriate fluid resuscitation in a critically ill patient but that she was unable to handle the fluid load.  Anticipate she will briskly diurese and already feels much better on my evaluation.  CT Chest at [facility] ruled out PE- normal heart size.Lungs/Pleura: No pulmonary infarction. Diffuse ground-glass opacities in a mosaic pattern. Mild enters mild interlobular septal thickening.   Upper Abdomen: Limited view of the liver, kidneys, pancreas are unremarkable. Normal adrenal glands.   Musculoskeletal: No aggressive osseous lesion.   Review of the MIP images confirms the above findings.   IMPRESSION: 1. No evidence acute pulmonary embolism. 2. Interlobular septal thickening and diffuse ground-glass opacities suggest interstitial and mild pulmonary edema pattern. 3. Mild RIGHT hilar adenopathy and mediastinal adenopathy. Favor reactive adenopathy.     Electronically Signed   By: Nancy Wu M.Wu.   On: 10/28/2021 10:37    Last seen at [facility] on 02-12-2018;  Nancy Wu is a 56 year old right-handed Caucasian female patient of Dr. Excell Wu,  presenting here in preparation for a weight loss surgery. She also has a history of long-standing headaches that preceded her weight gain. She had reported burping and aerophagia in 2018 and was evaluated for CPAP use, she has been doing remarkably well.  She has been exceeding a body mass index of 50 for the last 25 years. Weight gain  was not related to pregnancy, medication or any interventional  procedure or any medical event. She has a history of progressive obesity since her early 73s despite multiple attempts at medical management and dietary control. She also has put on an additional 20 pounds through the year 2017 after she lost her son. Obesity has affected the patient causing restricted mobility and chronic knee pain, she had GERD, hypertension, osteoarthritis and atrial fibrillation on Eliquis.  On CPAP , averaging 6 hours of sleep-  Sleep habits are as follows: The patient usually retreats to the bedroom around 10 PM, sleeps about an hour later, sometimes listening to soft music, sometimes watching TV. She sleeps on 4 pillows, seated or reclined. Orthopnoea- can't tolerate flat sleeping position, usually supine.  CPAP set at the patient has used her CPAP 100% of the time 30 out of 30 days, with an average use at time of 9 hours 17 minutes, she is using an air sense 10 AutoSet serial #[id number]  Minimum pressure is 5 maximum pressure of 15 cmH2O with a 3 cm EPR her residual AHI is only 1.4 very good resolution of apnea.  No central apneas are emerging.  The 95th percentile pressure that she needs is 14.9 which means that she currently straddles the upper setting.  Air leak is mild to moderate at 17.4 L/min at the 95th percentile she is using a nasal mask equal to an E son.   Sleep medical history and family sleep history: The patient suffered from night terrors during her childhood but was not a sleep walker. She endorsed chest pain, depression with anxiety, atrial fibrillation, back pain, GERD, hemorrhoids, hypertension, kidney stones in the past, migraine headaches are present, thyroid disease, no cancer or malignancy.   Social history: re-married 22 plus years, mother of 2, one living . Lost her 14 year old son to an overdose. Daughter turning 66, adopted son is 3. Non smoking since 2006, no alcohol, caffeine ; she drinks at least 28 ounces of coffee in the morning, and may  drink coffee later in the day, no soda nor ice tea and no energy drinks.   Review of Systems:  Numbness anterior , lateral thigh , between groin and knee.  Right leg . Beginning day after appendectomy.   Atrial fib with CHF, fluid accumulation, , cardiac palpitations, depression, insomnia, restless legs at times. SOB.   Out of a complete 14 system review, the patient complains of only the following symptoms, and all other reviewed systems are negative.    Her Epworth sleepiness score today is endorsed at 5/ 24  points, her fatigue severity score 46/ 63, very high.    Social History   Socioeconomic History   Marital status: Married    Spouse name: Not on file   Number of children: Not on file   Years of education: Not on file   Highest education level: Not on file  Occupational History   Not on file  Tobacco Use   Smoking status: Former   Smokeless tobacco: Never  Substance and Sexual Activity   Alcohol use: Yes    Comment: rare  Drug use: No   Sexual activity: Not Currently    Partners: Male    Birth control/protection: I.U.Wu.  Other Topics Concern   Not on file  Social History Narrative   Right handed   Caffeine use: 2 cups coffee per day, 2 sodas per day.   Social Determinants of Health   Financial Resource Strain: Not on file  Food Insecurity: Not on file  Transportation Needs: Not on file  Physical Activity: Not on file  Stress: Not on file  Social Connections: Not on file  Intimate Partner Violence: Not on file    Family History  Problem Relation Age of Onset   Depression Mother    Hypertension Mother    Stroke Father    Hypertension Father    Melanoma Father    Lymphoma Father    Heart failure Father    Depression Sister    Alcohol abuse Brother    Cancer Other    Hypertension Other    Stroke Other    Heart disease Other    Diabetes Other     Past Medical History:  Diagnosis Date   Acute pharyngitis due to other specified organisms  10/14/2020   ALLERGIC RHINITIS 08/20/2007   Qualifier: Diagnosis of  By: Daphine Deutscher FNP, Nykedtra     Anxiety    Borderline diabetes    Chest pain 05/08/2013   Normal coronary arteriography in November 2017   Chronic anticoagulation 09/26/2017   Chronic low back pain 01/25/2007   Qualifier: Diagnosis of  By: Bradly Bienenstock     Depression    Dyspnea 02/12/2021   Essential hypertension 01/25/2007   Qualifier: Diagnosis of  By: Bradly Bienenstock     Gastroesophageal reflux disease without esophagitis 09/29/2016   Heart disease    afib   High risk medication use 11/02/2016   Overview:  Multaq   HYPERTENSION, BENIGN SYSTEMIC 01/25/2007   Qualifier: Diagnosis of  By: Bradly Bienenstock     Hypertensive heart disease without heart failure 09/29/2016   Ingrown toenail 10/21/2012   IUD 2007   Lumbago with sciatica of left side    Migraine 07/22/2011   Morbid obesity (HCC)    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HCC) 11/14/2017   Obstructive sleep apnea treated with continuous positive airway pressure (CPAP) 02/12/2018   Palpitations 02/12/2021   Paroxysmal atrial fibrillation (HCC) 09/29/2016   PRESENCE OF IUD 11/28/2004   Qualifier: Diagnosis of  By: Daphine Deutscher FNP, Nykedtra     Reactive depression 01/25/2007   Qualifier: Diagnosis of  By: Bradly Bienenstock     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   Right knee meniscal tear 12/15/2011   Sciatica 01/25/2007   Qualifier: Diagnosis of  By: Bradly Bienenstock     Strain of left hamstring 06/01/2019    Past Surgical History:  Procedure Laterality Date   APPENDECTOMY     CARDIAC CATHETERIZATION Bilateral 09/29/2016   [place]   CESAREAN SECTION  1989   CHOLECYSTECTOMY  2010   KNEE SURGERY Bilateral    TUBAL LIGATION  1989    Current Outpatient Medications  Medication Sig Dispense Refill   apixaban (ELIQUIS) 5 MG TABS tablet TAKE 1 TABLET BY MOUTH TWICE A DAY 180 tablet 1   dronedarone (MULTAQ) 400 MG  tablet Take 1 tablet (400 mg total) by mouth 2 (two) times daily with a meal. 180 tablet 3   escitalopram (LEXAPRO) 20 MG tablet Take 20 mg by mouth daily.  furosemide (LASIX) 40 MG tablet Take 1 tablet (40 mg total) by mouth daily. 90 tablet 3   gabapentin (NEURONTIN) 800 MG tablet Take 800 mg by mouth 3 (three) times daily.     lisinopril-hydrochlorothiazide (ZESTORETIC) 20-12.5 MG tablet Take 1 tablet by mouth daily. 90 tablet 3   metoprolol succinate (TOPROL-XL) 25 MG 24 hr tablet Take 0.5 tablets (12.5 mg total) by mouth daily. 45 tablet 3   pantoprazole (PROTONIX) 40 MG tablet Take 1 tablet by mouth daily.     No current facility-administered medications for this visit.    Allergies as of 12/03/2021 - Review Complete 12/03/2021  Allergen Reaction Noted   Codeine Nausea Only     Vitals: BP (!) 149/76 (BP Location: Right Arm, Patient Position: Sitting, Cuff Size: Normal)    Pulse 74    Ht 5\' 3"  (1.6 m)    Wt (!) 369 lb (167.4 kg)    BMI 65.37 kg/m  Last Weight:  Wt Readings from Last 1 Encounters:  12/03/21 (!) 369 lb (167.4 kg)   [id number] mass index is 65.37 kg/m.     Last Height:   Ht Readings from Last 1 Encounters:  12/03/21 5\' 3"  (1.6 m)    Physical exam:  General: The patient is awake, alert and appears not in acute distress. The patient is well groomed. Head: Normocephalic, atraumatic. Neck is supple. Mallampati 3,  neck circumference: 17. 5 .  Nasal airflow patent,  Retrognathia is not seen. Crowded lower jaw.  Cardiovascular:  Slow rate and irregular rhythm , without  murmurs or carotid bruit, and without distended neck veins. Respiratory: Lungs are clear to auscultation. Skin:  With evidence of ankle edema, or rash.  Trunk: BMI is 65.  The patient's posture is erect.  Neurologic exam : The patient is awake and alert, oriented to place and time.  Attention span & concentration ability appears normal.  Speech is fluent,  without dysarthria, dysphonia or  aphasia. Mood and affect are appropriate.  Cranial nerves: Pupils are equal and briskly reactive to light. Funduscopic exam without evidence of pallor or edema. Extraocular movements  in vertical and horizontal planes intact and without nystagmus.  Visual fields by finger perimetry are intact. Hearing to finger rub intact. Facial sensation intact to fine touch. Facial motor strength is symmetric and tongue and uvula move midline. Shoulder shrug was symmetrical.   Motor exam:   Normal tone, muscle bulk and symmetric strength in all extremities. Bilateral strong grip.  Sensory:  Fine touch, pinprick and vibration were tested in all extremities.   The numbness in the right thigh follows the distribution of the femoral sensory branch.   Proprioception tested in the upper extremities was normal. Coordination: Rapid alternating movements in the fingers/hands was normal. Finger-to-nose maneuver  normal without evidence of ataxia, dysmetria or tremor. Gait and station: Patient walks without assistive device and is able unassisted to rise from a chair- but braces herself and needs to wait before moving, lightheadedness. Strength within normal limits. Arthralgic gait. Stance is wide based - Turns with 4 Steps.  Deep tendon reflexes: in the  upper and lower extremities are symmetric and intact. Babinski maneuver response is downgoing.    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 This is not uncommon in patients with obesity and pannus.   PLAN : NCV and EMG with Dr Jaynee Eagles, MD   2)  This patient has been a compliant CPP user, auto-set CPAP but will need an adjustment in her settings  until we can get her sleep study repeated.   PLAN : increase auto set pressure to 6- 17 cm water, leave EPR at 3 cm water,she would like a shorter RAMP time. Continue nasal mask.  Orthostasis, without central apnea ,but with remaining high fatigue,  Diastolic heart failure with worsening atrial fibrillation post COVID September 2022, patient was vaccinated and boostered.   She remains at very high risk for obesity hypoventilation. BMI is 65- and the patient has now CHF and is on [place] for antiarrhythmic medication .   atrial fibrillation, has had transient CHF after bout of rapid ventricular response.   PLAN : This patient 's co-morbidities make an attended sleep study necessary. Ordered SPLIT night study in BED 2 ( adjustable for orthopnea patients) SPLIT at AHI 10/h. [employer] sleep test rules. May need 02 added, please advise technician.    3) super obesity. Sleep study needs to be evaluated ASAP before she undergoes gastric bypass surgery. Her bariatric surgeon has meanwhile retired and she has to start over.  The patient was advised of the nature of the diagnosed disorder , the treatment options and the  risks for general health and wellness arising from not treating the condition.   I spent more than 45 minutes of face to face time with the patient.  Greater than 50% of time was spent in counseling and coordination of care. We have discussed the diagnosis and differential and I answered the patient's questions.    RV with patient after 2 months.    Nancy Seat, MD [id number], [id number] AM  Certified in Neurology by ABPN Certified in [place] by [facility] [address] [place], [place] [address]

## 2021-12-06 NOTE — Telephone Encounter (Signed)
Prior Auth denied, patient did not meet criteria. I

## 2021-12-08 ENCOUNTER — Encounter: Payer: Self-pay | Admitting: Cardiology

## 2021-12-08 NOTE — Telephone Encounter (Signed)
Do you know what criteria specifically was not met on the denial of Multaq?

## 2021-12-10 NOTE — Telephone Encounter (Signed)
Patient has been given the patient assistance forms to fill out and has been given samples to take while waiting on this determination.

## 2021-12-14 ENCOUNTER — Institutional Professional Consult (permissible substitution): Payer: Managed Care, Other (non HMO) | Admitting: Cardiology

## 2021-12-14 NOTE — Progress Notes (Signed)
Cardiology Office Note:    Date:  12/15/2021   ID:  Nancy Wu, DOB [birth year], MRN [id number]  PCP:  Julianne Handler, NP  Cardiologist:  Norman Herrlich, MD    Referring MD: Physicians, Cheryln Manly F*    ASSESSMENT:    1. Paroxysmal atrial fibrillation (HCC)   2. Hypertensive heart disease with chronic diastolic congestive heart failure (HCC)   3. Chronic anticoagulation   4. Sleep apnea, unspecified type    PLAN:    In order of problems listed above:  Good response using Multaq for paroxysmal atrial fibrillation with hypertensive heart disease and normal ejection fraction.  If she can receive financial assistance from the company will continue the drug if not we will switch to propafenone SR.  Continue anticoagulation Stable BP at target continue current meds check renal function Continue CPAP   Next appointment: 6 months   Medication Adjustments/Labs and Tests Ordered: Current medicines are reviewed at length with the patient today.  Concerns regarding medicines are outlined above.  Orders Placed This Encounter  Procedures   EKG 12-Lead   No orders of the defined types were placed in this encounter.   Chief Complaint  Patient presents with   Follow-up   Congestive Heart Failure   Atrial Fibrillation    History of Present Illness:    Nancy Wu is a 56 y.o. female with a hx of paroxysmal atrial fibrillation previously suppressed with flecainide morbid obesity and obstructive sleep apnea last seen 12/01/2021 following hospitalization with heart failure precipitated by IV fluid loading with acute medical medical illness.  Because of flecainide therapy and heart failure the drug was discontinued I placed her back on a loop diuretic but she was in decompensated heart failure and advised SGLT2 inhibitor.  She has sleep apnea and CPAP therapy prescribed by [facility] neurology.  She was initiated on Multaq as an outpatient for diastolic heart failure.  Compliance  with diet, lifestyle and medications: Yes  She is quite frustrated her insurance would not allow her access to Multaq with a reference using other drugs like disopyramide quinidine sotalol that I think have a disproportionate risk for safety ratio.  The options for her would be amiodarone or propafenone.  If she has not applied for financial assistance the company will do it if she has well initiated propafenone. She is frustrated because she feels quite well no edema shortness of breath chest pain palpitation or syncope.  She has had muscle cramping requesting recheck renal function potassium  She was admitted to [facility] 10/27/2021 discharged 10/30/2021 with admission diagnosis of acute heart failure echocardiogram showed continued preserved ejection fraction 55 to 60% heart failure is attributed to IV fluid loading during private hospitalization at [facility] and subsequently had acute deterioration in kidney function related to diuresis.  She had recently been admitted to [facility] with appendicitis had appendectomy and presented to the emergency room with acute decompensated heart failure she was also anemic with a hemoglobin of 10.2 hypokalemic potassium 3.3 albumin is diminished at 3.1 and she maintains sinus rhythm through the hospitalization.   Echo 10/28/2021:  1. Left ventricular ejection fraction, by estimation, is 55 to 60%. The  left ventricle has normal function. The left ventricle has no regional  wall motion abnormalities. Left ventricular diastolic parameters were  normal.   2. Right ventricular systolic function is normal. The right ventricular  size is normal.   3. The mitral valve is normal in structure. Mild mitral valve  regurgitation. No evidence of mitral stenosis.   4. The aortic valve was not well visualized. Aortic valve regurgitation  is not visualized. No aortic stenosis is present.    She had a myocardial perfusion study at [facility]  07/26/2021 showing normal perfusion EF 59% low risk test Past Medical History:  Diagnosis Date   Acute pharyngitis due to other specified organisms 10/14/2020   ALLERGIC RHINITIS 08/20/2007   Qualifier: Diagnosis of  By: Daphine Deutscher FNP, Nykedtra     Anxiety    Borderline diabetes    Chest pain 05/08/2013   Normal coronary arteriography in November 2017   Chronic anticoagulation 09/26/2017   Chronic low back pain 01/25/2007   Qualifier: Diagnosis of  By: Bradly Bienenstock     Depression    Dyspnea 02/12/2021   Essential hypertension 01/25/2007   Qualifier: Diagnosis of  By: Bradly Bienenstock     Gastroesophageal reflux disease without esophagitis 09/29/2016   Heart disease    afib   High risk medication use 11/02/2016   Overview:  Multaq   HYPERTENSION, BENIGN SYSTEMIC 01/25/2007   Qualifier: Diagnosis of  By: Bradly Bienenstock     Hypertensive heart disease without heart failure 09/29/2016   Ingrown toenail 10/21/2012   IUD 2007   Lumbago with sciatica of left side    Migraine 07/22/2011   Morbid obesity (HCC)    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HCC) 11/14/2017   Obstructive sleep apnea treated with continuous positive airway pressure (CPAP) 02/12/2018   Palpitations 02/12/2021   Paroxysmal atrial fibrillation (HCC) 09/29/2016   PRESENCE OF IUD 11/28/2004   Qualifier: Diagnosis of  By: Daphine Deutscher FNP, Nykedtra     Reactive depression 01/25/2007   Qualifier: Diagnosis of  By: Bradly Bienenstock     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   Right knee meniscal tear 12/15/2011   Sciatica 01/25/2007   Qualifier: Diagnosis of  By: Bradly Bienenstock     Strain of left hamstring 06/01/2019    Past Surgical History:  Procedure Laterality Date   APPENDECTOMY     CARDIAC CATHETERIZATION Bilateral 09/29/2016   [place]   CESAREAN SECTION  1989   CHOLECYSTECTOMY  2010   KNEE SURGERY Bilateral    TUBAL LIGATION  1989    Current Medications: Current Meds   Medication Sig   apixaban (ELIQUIS) 5 MG TABS tablet TAKE 1 TABLET BY MOUTH TWICE A DAY   dronedarone (MULTAQ) 400 MG tablet Take 1 tablet (400 mg total) by mouth 2 (two) times daily with a meal.   escitalopram (LEXAPRO) 20 MG tablet Take 20 mg by mouth daily.   furosemide (LASIX) 40 MG tablet Take 1 tablet (40 mg total) by mouth daily.   gabapentin (NEURONTIN) 800 MG tablet Take 800 mg by mouth 3 (three) times daily.   lisinopril-hydrochlorothiazide (ZESTORETIC) 20-12.5 MG tablet Take 1 tablet by mouth daily.   metoprolol succinate (TOPROL-XL) 25 MG 24 hr tablet Take 0.5 tablets (12.5 mg total) by mouth daily.   pantoprazole (PROTONIX) 40 MG tablet Take 1 tablet by mouth daily.     Allergies:   Codeine   Social History   Socioeconomic History   Marital status: Married    Spouse name: Not on file   Number of children: Not on file   Years of education: Not on file   Highest education level: Not on file  Occupational History   Not on file  Tobacco Use   Smoking status:  Former    Passive exposure: Past   Smokeless tobacco: Never  Vaping Use   Vaping Use: Never used  Substance and Sexual Activity   Alcohol use: Yes    Comment: rare   Drug use: No   Sexual activity: Not Currently    Partners: Male    Birth control/protection: I.U.D.  Other Topics Concern   Not on file  Social History Narrative   Right handed   Caffeine use: 2 cups coffee per day, 2 sodas per day.   Social Determinants of Health   Financial Resource Strain: Not on file  Food Insecurity: Not on file  Transportation Needs: Not on file  Physical Activity: Not on file  Stress: Not on file  Social Connections: Not on file     Family History: The patient's family history includes Alcohol abuse in her brother; Cancer in an other family member; Depression in her mother and sister; Diabetes in an other family member; Heart disease in an other family member; Heart failure in her father; Hypertension in her  father, mother, and another family member; Lymphoma in her father; Melanoma in her father; Stroke in her father and another family member. ROS:   Please see the history of present illness.    All other systems reviewed and are negative.  EKGs/Labs/Other Studies Reviewed:    The following studies were reviewed today:  EKG:  EKG ordered today and personally reviewed.  The ekg ordered today demonstrates sinus rhythm normal EKG no signs of antiarrhythmic drug toxicity  Recent Labs: 10/27/2021: B Natriuretic Peptide 248.9 10/29/2021: ALT 20; Magnesium 2.1; TSH 0.905 10/30/2021: BUN 8; Creatinine, Ser 1.23; Hemoglobin 11.3; Platelets 461; Potassium 4.3; Sodium 139  Recent Lipid Panel    Component Value Date/Time   CHOL 155 04/22/2011 0900   TRIG 112 04/22/2011 0900   HDL 45 04/22/2011 0900   CHOLHDL 3.4 04/22/2011 0900   VLDL 22 04/22/2011 0900   LDLCALC 88 04/22/2011 0900   LDLDIRECT 80 05/08/2013 1636    Physical Exam:    VS:  BP 116/68 (BP Location: Right Wrist)    Pulse (!) 59    Ht 5\' 3"  (1.6 m)    Wt (!) 360 lb 12.8 oz (163.7 kg)    SpO2 97%    BMI 63.91 kg/m     Wt Readings from Last 3 Encounters:  12/15/21 (!) 360 lb 12.8 oz (163.7 kg)  12/03/21 (!) 369 lb (167.4 kg)  12/01/21 (!) 365 lb 1.9 oz (165.6 kg)     GEN:  Well nourished, well developed in no acute distress HEENT: Normal NECK: No JVD; No carotid bruits LYMPHATICS: No lymphadenopathy CARDIAC: RRR, no murmurs, rubs, gallops RESPIRATORY:  Clear to auscultation without rales, wheezing or rhonchi  ABDOMEN: Soft, non-tender, non-distended MUSCULOSKELETAL:  No edema; No deformity  SKIN: Warm and dry NEUROLOGIC:  Alert and oriented x 3 PSYCHIATRIC:  Normal affect    Signed, Norman HerrlichBrian Takeia Ciaravino, MD  12/15/2021 2:15 PM    [facility]

## 2021-12-15 ENCOUNTER — Encounter: Payer: Self-pay | Admitting: Cardiology

## 2021-12-15 ENCOUNTER — Other Ambulatory Visit: Payer: Self-pay

## 2021-12-15 ENCOUNTER — Ambulatory Visit: Payer: Managed Care, Other (non HMO) | Admitting: Cardiology

## 2021-12-15 VITALS — BP 116/68 | HR 59 | Ht 63.0 in | Wt 360.8 lb

## 2021-12-15 DIAGNOSIS — G4730 Sleep apnea, unspecified: Secondary | ICD-10-CM | POA: Diagnosis not present

## 2021-12-15 DIAGNOSIS — I480 Paroxysmal atrial fibrillation: Secondary | ICD-10-CM | POA: Diagnosis not present

## 2021-12-15 DIAGNOSIS — I5032 Chronic diastolic (congestive) heart failure: Secondary | ICD-10-CM

## 2021-12-15 DIAGNOSIS — I110 Hypertensive heart disease with heart failure: Secondary | ICD-10-CM

## 2021-12-15 DIAGNOSIS — Z7901 Long term (current) use of anticoagulants: Secondary | ICD-10-CM

## 2021-12-15 NOTE — Patient Instructions (Signed)
Medication Instructions:  Your physician recommends that you continue on your current medications as directed. Please refer to the Current Medication list given to you today.  *If you need a refill on your cardiac medications before your next appointment, please call your pharmacy*   Lab Work: Your physician recommends that you return for lab work in: TODAY BMP, ProBNP If you have labs (blood work) drawn today and your tests are completely normal, you will receive your results only by: MyChart Message (if you have MyChart) OR A paper copy in the mail If you have any lab test that is abnormal or we need to change your treatment, we will call you to review the results.   Testing/Procedures: NonE   Follow-Up: At [facility], you and your health needs are our priority.  As part of our continuing mission to provide you with exceptional heart care, we have created designated Provider Care Teams.  These Care Teams include your primary Cardiologist (physician) and Advanced Practice Providers (APPs -  Physician Assistants and Nurse Practitioners) who all work together to provide you with the care you need, when you need it.  We recommend signing up for the patient portal called "MyChart".  Sign up information is provided on this After Visit Summary.  MyChart is used to connect with patients for Virtual Visits (Telemedicine).  Patients are able to view lab/test results, encounter notes, upcoming appointments, etc.  Non-urgent messages can be sent to your provider as well.   To learn more about what you can do with MyChart, go to [email or web address].    Your next appointment:   3 month(s)  The format for your next appointment:   In Person  Provider:   Norman Herrlich, MD    Other Instructions

## 2021-12-16 ENCOUNTER — Telehealth: Payer: Self-pay

## 2021-12-16 LAB — BASIC METABOLIC PANEL
BUN/Creatinine Ratio: 17 (ref 9–23)
BUN: 17 mg/dL (ref 6–24)
CO2: 26 mmol/L (ref 20–29)
Calcium: 9.3 mg/dL (ref 8.7–10.2)
Chloride: 100 mmol/L (ref 96–106)
Creatinine, Ser: 1.02 mg/dL — ABNORMAL HIGH (ref 0.57–1.00)
Glucose: 95 mg/dL (ref 70–99)
Potassium: 4.4 mmol/L (ref 3.5–5.2)
Sodium: 139 mmol/L (ref 134–144)
eGFR: 65 mL/min/{1.73_m2} (ref >59)

## 2021-12-16 LAB — PRO B NATRIURETIC PEPTIDE: NT-Pro BNP: 318 pg/mL — ABNORMAL HIGH (ref 0–249)

## 2021-12-16 NOTE — Telephone Encounter (Signed)
-----   Message from Baldo Daub, MD sent at 12/16/2021  6:27 AM EST ----- Regarding: FW: Good result no changes in treatment ----- Message ----- From: Interface, [facility] Lab Results In Sent: 12/16/2021   5:38 AM EST To: Baldo Daub, MD

## 2021-12-16 NOTE — Telephone Encounter (Signed)
Left message on patients voicemail to please return our call.   

## 2021-12-17 ENCOUNTER — Telehealth: Payer: Self-pay

## 2021-12-17 NOTE — Telephone Encounter (Signed)
Left message on patients voicemail to please return our call.   

## 2021-12-17 NOTE — Telephone Encounter (Signed)
Pt is returning call.  

## 2021-12-17 NOTE — Telephone Encounter (Signed)
-----   Message from Gita Kudo, RN sent at 12/17/2021  7:11 AM EST ----- Regarding: FW:  ----- Message ----- From: Richardo Priest, MD Sent: 12/16/2021   6:27 AM EST To: Gita Kudo, RN Subject: FW:                                            Good result no changes in treatment ----- Message ----- From: Lavone Neri Lab Results In Sent: 12/16/2021   5:38 AM EST To: Richardo Priest, MD

## 2021-12-17 NOTE — Telephone Encounter (Signed)
Spoke with patient regarding results and recommendation.  Patient verbalizes understanding and is agreeable to plan of care. Advised patient to call back with any issues or concerns.  

## 2021-12-28 ENCOUNTER — Encounter: Payer: Self-pay | Admitting: Cardiology

## 2021-12-30 ENCOUNTER — Encounter: Payer: Managed Care, Other (non HMO) | Admitting: Diagnostic Neuroimaging

## 2022-01-03 ENCOUNTER — Ambulatory Visit: Payer: 59 | Admitting: Cardiology

## 2022-01-24 ENCOUNTER — Ambulatory Visit: Payer: 59 | Admitting: Neurology

## 2022-01-27 ENCOUNTER — Encounter: Payer: Managed Care, Other (non HMO) | Admitting: Diagnostic Neuroimaging

## 2022-02-03 ENCOUNTER — Encounter: Payer: Self-pay | Admitting: Cardiology

## 2022-02-09 ENCOUNTER — Telehealth: Payer: Self-pay

## 2022-02-09 NOTE — Telephone Encounter (Signed)
? ?  Pre-operative Risk Assessment  ?  ?Patient Name: Nancy Wu  ?DOB: 02-03-1966 ?MRN: [id number]  ? ? ? ?Request for Surgical Clearance   ? ?Procedure:   DENTAL TREAMENT ? ?Date of Surgery:  Clearance TBD                              ?   ?Surgeon:  Dr. Leonides Schanz ?Surgeon's Group or Practice Name:  [facility] [place] ?Phone number:  [phone] ?Fax number:  [phone] ?  ?Type of Clearance Requested:   ?- Pharmacy:  Hold Apixaban (Eliquis) Eliquis ?  ?Type of Anesthesia:  Local  ?  ?Additional requests/questions:   Treatment may include:  Cleaning (simple or deep)  / Radiographs / Fillings / Extractions ( simple or surgical ) ? ?Signed, ?Toni Arthurs   ?02/09/2022, 10:33 AM   ?

## 2022-02-09 NOTE — Telephone Encounter (Signed)
? ?  Patient Name: Nancy Wu  ?DOB: 1965/12/18 ?MRN: [id number] ? ?Primary Cardiologist: Armanda Magic, MD ? ?Chart reviewed as part of pre-operative protocol coverage.  ? ?Simple (1-2 teeth) dental extractions, crowns and cleaning are considered low risk procedures per guidelines and generally do not require any specific cardiac clearance. It is also generally accepted that for simple extractions and dental cleanings, there is no need to interrupt blood thinner therapy. ? ?If she has dental extractions that require removal of more than 2 teeth, please let us know.  In that case, our clinical pharmacist will have to calculate how long to hold her Eliquis prior to the procedure. ? ?SBE prophylaxis is not required for the patient from a cardiac standpoint as she does not have any prior history of valve surgery, endocarditis or congenital heart issue. ? ?I will route this recommendation to the requesting party via Epic fax function and remove from pre-op pool. ? ?Please call with questions. ? ?Azalee Course, [place] ?02/09/2022, 10:49 AM ? ?

## 2022-02-25 ENCOUNTER — Other Ambulatory Visit: Payer: Self-pay | Admitting: Cardiology

## 2022-02-25 DIAGNOSIS — I480 Paroxysmal atrial fibrillation: Secondary | ICD-10-CM

## 2022-02-28 NOTE — Telephone Encounter (Signed)
Prescription refill request for Eliquis received. ?Indication:Afib ?Last office visit:1/23 ?Scr:1.0 ?Age: 56 ?Weight:163.7 kg ? ?Prescription refilled ? ?

## 2022-03-08 ENCOUNTER — Ambulatory Visit (INDEPENDENT_AMBULATORY_CARE_PROVIDER_SITE_OTHER): Payer: Managed Care, Other (non HMO) | Admitting: Neurology

## 2022-03-08 DIAGNOSIS — G4733 Obstructive sleep apnea (adult) (pediatric): Secondary | ICD-10-CM | POA: Diagnosis not present

## 2022-03-08 DIAGNOSIS — R0601 Orthopnea: Secondary | ICD-10-CM

## 2022-03-08 DIAGNOSIS — I480 Paroxysmal atrial fibrillation: Secondary | ICD-10-CM

## 2022-03-08 DIAGNOSIS — Z9989 Dependence on other enabling machines and devices: Secondary | ICD-10-CM

## 2022-03-08 DIAGNOSIS — I5033 Acute on chronic diastolic (congestive) heart failure: Secondary | ICD-10-CM

## 2022-03-08 DIAGNOSIS — E669 Obesity, unspecified: Secondary | ICD-10-CM

## 2022-03-10 ENCOUNTER — Telehealth: Payer: Self-pay | Admitting: Cardiology

## 2022-03-10 NOTE — Telephone Encounter (Signed)
Patient came in and picked up second bottle of multaq, wants to know if there's any progress on third bottle and if refill can go ahead an be sent in for next batch ? ?Best number to reach you [phone] ? ? ?

## 2022-03-10 NOTE — Telephone Encounter (Signed)
Any updates for the patient ?  ?

## 2022-03-14 DIAGNOSIS — Z9989 Dependence on other enabling machines and devices: Secondary | ICD-10-CM

## 2022-03-14 DIAGNOSIS — I5033 Acute on chronic diastolic (congestive) heart failure: Secondary | ICD-10-CM | POA: Insufficient documentation

## 2022-03-14 DIAGNOSIS — R0601 Orthopnea: Secondary | ICD-10-CM | POA: Insufficient documentation

## 2022-03-14 DIAGNOSIS — E669 Obesity, unspecified: Secondary | ICD-10-CM | POA: Insufficient documentation

## 2022-03-14 HISTORY — DX: Acute on chronic diastolic (congestive) heart failure: I50.33

## 2022-03-14 HISTORY — DX: Obesity, unspecified: E66.9

## 2022-03-14 HISTORY — DX: Orthopnea: R06.01

## 2022-03-14 HISTORY — DX: Dependence on other enabling machines and devices: Z99.89

## 2022-03-14 NOTE — Addendum Note (Signed)
Addended by: Melvyn Novas on: 03/14/2022 06:29 PM ? ? Modules accepted: Orders ? ?

## 2022-03-14 NOTE — Procedures (Signed)
PATIENT'S NAME:  Nancy Wu, Nancy Wu ?DOB:      02-Apr-1966      ?MR#:    [id number]     ?DATE OF RECORDING: 03/08/2022 ?REFERRING M.D.:  Buckner MaltaJennifer Burgart, MD and  Burke KeelsSarah Routh, PA  ?Study Performed:  Split-Night Titration Study ?HISTORY:  Nancy Reiningamela Viles is a 56 y.o. female who was seen in pour sleep clinic upon referral from PA at [facility] for an evaluation of left thigh numbness following append[facility] in November [facility], she was released on a Wednesday and returned the following Wednesday, 7 days later on 10-27-2021 (? just a bad Thanksgiving?)  to [facility] with severe SOB and ankle edema, was diagnosed with acute CHF.  She was in atrial fibrillation, felt impaired, fatigued, and scared by palpitations, dizziness, lightheadedness. Paroxysmal atrial fib with medication rate control on Toprol. She was on flecainide, but this was just d/c on Wednesday 12-01-2021.  ?Previously, she had contracted influenza in August 2022 and Covid in September 2022. Atrial fibrillation was worsening with Covid infection, lasting 12-18 hours, which was new. She had atrial fibrillation before - but it would last only a shot while, never hours. She gained fluid weight since. She is considered super obese. ? ? ? [facility]; 4 years ago:  ??Mrs. Ephriam KnucklesChristian is a 56 year old right-handed Caucasian female patient of Dr. Glenna FellowsBenjamin Hoxworth, presenting here in preparation for a weight loss surgery. She also has a history of long-standing headaches that preceded her weight gain. She had reported burping and aerophagia in 2018 and was evaluated for CPAP use, she has been doing remarkably well. She has been exceeding a body mass index of 50 for the last 25 years.  ?Obesity has affected the patient causing restricted mobility and chronic knee pain, she had GERD, hypertension, osteoarthritis, and atrial fibrillation on Eliquis?. ?  ?The patient endorsed the Epworth Sleepiness Scale at 5 points   ?The  patient's weight 369 pounds with a height of 65 (inches), resulting in a BMI of 61.3 kg/m2. ?The patient's neck circumference measured 17.5 inches. ? ?CURRENT MEDICATIONS: Eliquis, Mutlaq, Lexapro, Lasix, Neurontin, Zestoretic, Toprol-XL, Protonix ?  ?PROCEDURE:  This is a multichannel digital polysomnogram utilizing the Somnostar 11.2 system.  Electrodes and sensors were applied and monitored per AASM Specifications.   EEG, EOG, Chin and Limb EMG, were sampled at 200 Hz.  ECG, Snore and Nasal Pressure, Thermal Airflow, Respiratory Effort, CPAP Flow and Pressure, Oximetry was sampled at 50 Hz. Digital video and audio were recorded.     ? ?BASELINE STUDY WITHOUT CPAP RESULTS: ? ?Lights Out was at 21:36 and Lights On at 04:59.  Total recording time (TRT) was 200, with a total sleep time (TST) of 128 minutes.   The patient's sleep latency was 53.5 minutes.  REM latency was 0 minutes.  The sleep efficiency was 64. %.   ? ?SLEEP ARCHITECTURE: WASO (Wake after sleep onset) was 24 minutes, Stage N1 was 13 minutes, Stage N2 was 115 minutes, Stage N3 was 0 minutes and Stage R (REM sleep) was 0 minutes.  The percentages were Stage N1 10.2%, Stage N2 89.8%, Stage N3 0% and Stage R (REM sleep) 0%.  ?The arousals were noted as: 20 were spontaneous, 3 were associated with PLMs, 8 were associated with respiratory events. ? ?RESPIRATORY ANALYSIS:  There were a total of 50 respiratory events:  0 obstructive apneas, 0 central apneas and 0 mixed apneas with a total of 0 apneas and an apnea  index (AI) of 0. There were 50 hypopneas with a hypopnea index of 23.4.  ?    ?The total APNEA/HYPOPNEA INDEX (AHI) was 23.4 /hour and the total RESPIRATORY DISTURBANCE INDEX was 23.4 /hour.  0 events occurred in REM sleep and 100 events in NREM. The REM AHI was 0, /hour versus a non-REM AHI of 23.4 /hour.  ?The patient spent 50.5 minutes sleep time in the supine position 317 minutes in non-supine. The supine AHI was 25.0 /hour versus a non-supine  AHI of 22.5 /hour. ? ?OXYGEN SATURATION & C02:  The wake baseline 02 saturation was 94%, with the lowest being 81%. Time spent below 89% saturation equaled 20 minutes. ? ?PERIODIC LIMB MOVEMENTS: The patient had a total of 105 Periodic Limb Movements. The Periodic Limb Movement (PLM) Arousal index was 1.4 /hour. ?Audio and video analysis did not show any abnormal or unusual movements, behaviors, phonations or vocalizations. Moderate Snoring was noted. EKG was in keeping with normal sinus rhythm (NSR) ? ?TITRATION STUDY WITH CPAP RESULTS: ?  ?CPAP was initiated under a ResMed Airfit N20 small- at 5 cmH20 with heated humidity per AASM split night standards and pressure was advanced to 8 cmH20 because of hypopneas, apneas and desaturations.  At a PAP pressure of 8 cmH20, there was a reduction of the AHI to 1.9 /hour.  ? ?Total recording time (TRT) was 243.5 minutes, with a total sleep time (TST) of 239.5 minutes. The patient's sleep latency was 3 minutes. REM latency was 171 minutes.  The sleep efficiency was 98.4 %.   ? ?SLEEP ARCHITECTURE: Wake after sleep was 1 minutes, Stage N1 2.5 minutes, Stage N2 149 minutes, Stage N3 76 minutes and Stage R (REM sleep) 12 minutes. The percentages were: Stage N1 1.%, Stage N2 62.2%, Stage N3 31.7% and Stage R (REM sleep) 5.0%.  ?The arousals were noted as: 26 were spontaneous, 0 were associated with PLMs, 1 was associated with respiratory events. ? ?RESPIRATORY ANALYSIS:  There were a total of 11 respiratory events: 0 obstructive apneas, 1 central apnea and 0 mixed apneas with 10 hypopneas . ? ?The total APNEA/HYPOPNEA INDEX (AHI) was 2.8 /hour.  2 events occurred in REM sleep and 9 events in NREM. The REM AHI was 10 /hour versus a non-REM AHI of 2.4 /hour. REM sleep was achieved on a pressure of 7 cm/ H20. The patient spent no sleep time in the supine position.  ? ?OXYGEN SATURATION & C02:  The wake baseline 02 saturation was 95%, with the lowest being 78%. Time spent below 89%  saturation equaled 10 minutes. ? ?PERIODIC LIMB MOVEMENTS:   The patient had a total of 45 Periodic Limb Movements. The Periodic Limb Movement (PLM) Arousal index was 0 /hour. ? ?POLYSOMNOGRAPHY IMPRESSION :  ? ?Moderate Obstructive Sleep Apnea (OSA) at baseline with an AHI of 23.4/h and prolonged hypoxia improved under CPAP at 7 and 8 cm water to AHI of 1.9/h.  ?Periodic Limb Movement Disorder (PLMD) did not lead to arousals. PLMs ceased under CPAP.  ?Non-specific abnormal EKG with irregular R to R interval time. ? ? ?RECOMMENDATIONS: this patient will continue to benefit from CPAP therapy and should be set up with an autotitration device ResMed, pressures from 6 through 13 cm water, 2 cm EPR, nasal pillow of patient's choice, Bella swift small. Machine will provide heated humidification.  ?  ?A follow up appointment will be scheduled in the Sleep Clinic at [facility].    ? ?I certify that I  have reviewed the entire raw data recording prior to the issuance of this report in accordance with the Standards of Accreditation of the American Academy of Sleep Medicine (AASM) ? ? ?Melvyn Novas, M.D. ?Medical Director, [employer] at [facility] ?Diplomat, Biomedical engineer of Neurology and Sleep Medicine (Neurology and Sleep Medicine) ? ? ? ? ? ? ?

## 2022-03-14 NOTE — Progress Notes (Signed)
POLYSOMNOGRAPHY IMPRESSION?:  ?? ?1. Moderate Obstructive Sleep Apnea (OSA) at baseline with an AHI of 23.4/h and prolonged hypoxia improved under CPAP at 7 and 8 cm water to AHI of 1.9/h.  ?2. Periodic Limb Movement Disorder (PLMD) did not lead to arousals. PLMs ceased under CPAP.  ?3. Non-specific abnormal EKG with irregular R to R interval time. ?? ?? ?RECOMMENDATIONS: this patient will continue to benefit from CPAP therapy and should be set up with an autotitration device ResMed, pressures from 6 through 13 cm water, 2 cm EPR, nasal pillow of patient's choice, Bella swift small. Machine will provide heated humidification.  ?

## 2022-03-15 ENCOUNTER — Encounter: Payer: Self-pay | Admitting: Neurology

## 2022-03-16 NOTE — Telephone Encounter (Signed)
Pt aware we have her medication ready for pickup. ?

## 2022-03-22 ENCOUNTER — Telehealth: Payer: Self-pay | Admitting: *Deleted

## 2022-03-22 ENCOUNTER — Ambulatory Visit: Payer: Managed Care, Other (non HMO) | Admitting: Cardiology

## 2022-03-22 ENCOUNTER — Encounter: Payer: Self-pay | Admitting: Cardiology

## 2022-03-22 VITALS — BP 136/70 | HR 56 | Wt 360.8 lb

## 2022-03-22 DIAGNOSIS — I119 Hypertensive heart disease without heart failure: Secondary | ICD-10-CM

## 2022-03-22 DIAGNOSIS — I480 Paroxysmal atrial fibrillation: Secondary | ICD-10-CM | POA: Diagnosis not present

## 2022-03-22 DIAGNOSIS — Z7901 Long term (current) use of anticoagulants: Secondary | ICD-10-CM

## 2022-03-22 DIAGNOSIS — Z79899 Other long term (current) drug therapy: Secondary | ICD-10-CM | POA: Diagnosis not present

## 2022-03-22 MED ORDER — FUROSEMIDE 40 MG PO TABS: 40.0000 mg | ORAL_TABLET | Freq: Two times a day (BID) | ORAL | 1 refills | Status: DC

## 2022-03-22 NOTE — Telephone Encounter (Signed)
Spoke with Waynetta Sandy at [facility] who provides Multaq to pt. Inquired about a new 3 month supply of medication for this pt. Beth stated we would need to fax a reorder form to them with new prescription request. She will fax this form to [place] to fill out and fax back. ?

## 2022-03-22 NOTE — Progress Notes (Signed)
?Cardiology Office Note:   ? ?Date:  03/22/2022  ? ?ID:  Nancy Wu, DOB 08-31-1966, MRN [id number] ? ?PCP:  Elenore Paddy, NP  ?Cardiologist:  Shirlee More, MD   ? ?Referring MD: Elenore Paddy, NP  ? ? ?ASSESSMENT:   ? ?1. Paroxysmal atrial fibrillation (HCC)   ?2. High risk medication use   ?3. Chronic anticoagulation   ?4. Hypertensive heart disease without heart failure   ? ?PLAN:   ? ?In order of problems listed above: ? ?Continues to do well and Multaq we have done an EKG after initiation she has had no breakthrough episodes I think we can wait for her next visit to repeat she will continue her antiarrhythmic drug beta-blocker minimum dose and Eliquis. ?Continue anticoagulant ?Unfortunately with gabapentin therapy she has a marked tendency to edema regarding increase her diuretic to twice a day and she had lab work drawn this morning in her PCP office and continue her combination ACE thiazide diuretic ? ? ?Next appointment: 6 months ? ? ?Medication Adjustments/Labs and Tests Ordered: ?Current medicines are reviewed at length with the patient today.  Concerns regarding medicines are outlined above.  ?No orders of the defined types were placed in this encounter. ? ?No orders of the defined types were placed in this encounter. ? ? ?Chief Complaint  ?Patient presents with  ? Follow-up  ? Atrial Fibrillation  ? ? ?History of Present Illness:   ? ?Nancy Wu is a 56 y.o. female with a hx of paroxysmal atrial fibrillation maintaining sinus rhythm on Multaq morbid obesity struct of sleep apnea and heart failure last seen 12/15/2021.She was admitted to [facility] 10/27/2021 discharged 10/30/2021 with admission diagnosis of acute heart failure echocardiogram showed continued preserved ejection fraction 55 to 60% heart failure is attributed to IV fluid loading during private hospitalization at [facility] with appendicitis and systemic inflammatory response syndrome and subsequently had acute  deterioration in kidney function related to diuresis.  She was last seen 12/15/2021  ? ?compliance with diet, lifestyle and medications: Yes ? ?Overall is done well except with gabapentin she continues to have edema and has increased her diuretic to twice daily with good result ?No episodes of rapid heart rhythm chest pain palpitation or syncope ?No bleeding with her anticoagulant ?Labs done this morning at PCP office awaiting results ?Past Medical History:  ?Diagnosis Date  ? Acute pharyngitis due to other specified organisms 10/14/2020  ? ALLERGIC RHINITIS 08/20/2007  ? Qualifier: Diagnosis of  By: Hassell Done FNP, Tori Milks    ? Anxiety   ? Borderline diabetes   ? Chest pain 05/08/2013  ? Normal coronary arteriography in November 2017  ? Chronic anticoagulation 09/26/2017  ? Chronic low back pain 01/25/2007  ? Qualifier: Diagnosis of  By: Beryle Lathe    ? Depression   ? Dyspnea 02/12/2021  ? Essential hypertension 01/25/2007  ? Qualifier: Diagnosis of  By: Beryle Lathe    ? Gastroesophageal reflux disease without esophagitis 09/29/2016  ? Heart disease   ? afib  ? High risk medication use 11/02/2016  ? Overview:  Multaq  ? HYPERTENSION, BENIGN SYSTEMIC 01/25/2007  ? Qualifier: Diagnosis of  By: Beryle Lathe    ? Hypertensive heart disease without heart failure 09/29/2016  ? Ingrown toenail 10/21/2012  ? IUD 2007  ? Lumbago with sciatica of left side   ? Migraine 07/22/2011  ? Morbid obesity ([place])   ? 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  [place]) 11/14/2017  ? Obstructive sleep apnea treated with continuous positive airway pressure (CPAP) 02/12/2018  ? Palpitations 02/12/2021  ? Paroxysmal atrial fibrillation ([place]) 09/29/2016  ? PRESENCE OF IUD 11/28/2004  ? Qualifier: Diagnosis of  By: Hassell Done FNP, Tori Milks    ? Reactive depression 01/25/2007  ? Qualifier: Diagnosis of  By: Beryle Lathe    ? 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 Right knee meniscal tear 12/15/2011  ?  Sciatica 01/25/2007  ? Qualifier: Diagnosis of  By: Beryle Lathe    ? Strain of left hamstring 06/01/2019  ? ? ?Past Surgical History:  ?Procedure Laterality Date  ? APPENDECTOMY    ? CARDIAC CATHETERIZATION Bilateral 09/29/2016  ? [place]  ? [place]  ? CHOLECYSTECTOMY  2010  ? KNEE SURGERY Bilateral   ? TUBAL LIGATION  1989  ? ? ?Current Medications: ?Current Meds  ?Medication Sig  ? apixaban (ELIQUIS) 5 MG TABS tablet Take 1 tablet by mouth twice daily  ? buPROPion (WELLBUTRIN XL) 300 MG 24 hr tablet Take 300 mg by mouth daily.  ? dronedarone (MULTAQ) 400 MG tablet Take 1 tablet (400 mg total) by mouth 2 (two) times daily with a meal.  ? gabapentin (NEURONTIN) 800 MG tablet Take 800 mg by mouth 3 (three) times daily.  ? lisinopril-hydrochlorothiazide (ZESTORETIC) 20-12.5 MG tablet Take 1 tablet by mouth daily.  ? metoprolol succinate (TOPROL-XL) 25 MG 24 hr tablet Take 0.5 tablets (12.5 mg total) by mouth daily.  ? pantoprazole (PROTONIX) 40 MG tablet Take 1 tablet by mouth daily.  ? [DISCONTINUED] furosemide (LASIX) 40 MG tablet Take 1 tablet (40 mg total) by mouth daily.  ?  ? ?Allergies:   Codeine  ? ?Social History  ? ?Socioeconomic History  ? Marital status: Married  ?  Spouse name: Not on file  ? Number of children: Not on file  ? Years of education: Not on file  ? Highest education level: Not on file  ?Occupational History  ? Not on file  ?Tobacco Use  ? Smoking status: Former  ?  Passive exposure: Past  ? Smokeless tobacco: Never  ?Vaping Use  ? Vaping Use: Never used  ?Substance and Sexual Activity  ? Alcohol use: Yes  ?  Comment: rare  ? Drug use: No  ? Sexual activity: Not Currently  ?  Partners: Male  ?  Birth control/protection: I.U.D.  ?Other Topics Concern  ? Not on file  ?Social History Narrative  ? Right handed  ? Caffeine use: 2 cups coffee per day, 2 sodas per day.  ? ?Social Determinants of Health  ? ?Financial Resource Strain: Not on file  ?Food Insecurity: Not on file   ?Transportation Needs: Not on file  ?Physical Activity: Not on file  ?Stress: Not on file  ?Social Connections: Not on file  ?  ? ?Family History: ?The patient's family history includes Alcohol abuse in her brother; Cancer in an other family member; Depression in her mother and sister; Diabetes in an other family member; Heart disease in an other family member; Heart failure in her father; Hypertension in her father, mother, and another family member; Lymphoma in her father; Melanoma in her father; Stroke in her father and another family member. ?ROS:   ?Please see the history of present illness.    ?All other systems reviewed and are negative. ? ?EKGs/Labs/Other Studies Reviewed:   ? ?The following studies were reviewed today: ? ?EKG 12/15/2021 after starting Multaq showed sinus rhythm normal  QT interval ?Recent Labs: ?10/27/2021: B Natriuretic Peptide 248.9 ?10/29/2021: ALT 20; Magnesium 2.1; TSH 0.905 ?10/30/2021: Hemoglobin 11.3; Platelets 461 ?12/15/2021: BUN 17; Creatinine, Ser 1.02; NT-Pro BNP 318; Potassium 4.4; Sodium 139  ?Recent Lipid Panel ?   ?Component Value Date/Time  ? CHOL 155 04/22/2011 0900  ? TRIG 112 04/22/2011 0900  ? HDL 45 04/22/2011 0900  ? CHOLHDL 3.4 04/22/2011 0900  ? VLDL 22 04/22/2011 0900  ? [place] 88 04/22/2011 0900  ? LDLDIRECT 80 05/08/2013 1636  ? ? ?Physical Exam:   ? ?VS:  There were no vitals taken for this visit.   ? ?Wt Readings from Last 3 Encounters:  ?12/15/21 (!) 360 lb 12.8 oz (163.7 kg)  ?12/03/21 (!) 369 lb (167.4 kg)  ?12/01/21 (!) 365 lb 1.9 oz (165.6 kg)  ?  ? ?GEN:  Well nourished, well developed in no acute distress ?HEENT: Normal ?NECK: No JVD; No carotid bruits ?LYMPHATICS: No lymphadenopathy ?CARDIAC: RRR, no murmurs, rubs, gallops ?RESPIRATORY:  Clear to auscultation without rales, wheezing or rhonchi  ?ABDOMEN: Soft, non-tender, non-distended ?MUSCULOSKELETAL:  No edema; No deformity  ?SKIN: Warm and dry ?NEUROLOGIC:  Alert and oriented x 3 ?PSYCHIATRIC:   Normal affect  ? ? ?Signed, ?Shirlee More, MD  ?03/22/2022 3:40 PM    ?[place]  ?

## 2022-03-22 NOTE — Patient Instructions (Signed)
Medication Instructions:  ?Your physician has recommended you make the following change in your medication:  ?Change Furosemide to 40 mg two times daily ? ?*If you need a refill on your cardiac medications before your next appointment, please call your pharmacy* ? ? ?Lab Work: ?NONE ?If you have labs (blood work) drawn today and your tests are completely normal, you will receive your results only by: ?MyChart Message (if you have MyChart) OR ?A paper copy in the mail ?If you have any lab test that is abnormal or we need to change your treatment, we will call you to review the results. ? ? ?Testing/Procedures: ?NONE ? ? ?Follow-Up: ?At [facility], you and your health needs are our priority.  As part of our continuing mission to provide you with exceptional heart care, we have created designated Provider Care Teams.  These Care Teams include your primary Cardiologist (physician) and Advanced Practice Providers (APPs -  Physician Assistants and Nurse Practitioners) who all work together to provide you with the care you need, when you need it. ? ?We recommend signing up for the patient portal called "MyChart".  Sign up information is provided on this After Visit Summary.  MyChart is used to connect with patients for Virtual Visits (Telemedicine).  Patients are able to view lab/test results, encounter notes, upcoming appointments, etc.  Non-urgent messages can be sent to your provider as well.   ?To learn more about what you can do with MyChart, go to [email or web address].   ? ?Your next appointment:   ?6 month(s) ? ?The format for your next appointment:   ?In Person ? ?Provider:   ?Norman Herrlich, MD  ? ? ?Other Instructions ? ? ?Important Information About Sugar ? ? ? ? ?  ?

## 2022-03-30 ENCOUNTER — Telehealth: Payer: Self-pay | Admitting: *Deleted

## 2022-03-30 NOTE — Telephone Encounter (Signed)
Let pt know that their new shipment of Multaq is here to pick up. She will come by Thursday 5/4 to pick up. ?

## 2022-04-13 ENCOUNTER — Encounter: Payer: Self-pay | Admitting: Cardiology

## 2022-04-22 ENCOUNTER — Telehealth: Payer: Self-pay

## 2022-04-22 NOTE — Telephone Encounter (Signed)
Called the patient's primary care doctor attempting to get the patient's latest lab work. Left message for Neysa Bonito to return our call.

## 2022-04-27 ENCOUNTER — Ambulatory Visit (INDEPENDENT_AMBULATORY_CARE_PROVIDER_SITE_OTHER): Payer: Commercial Managed Care - HMO

## 2022-04-27 ENCOUNTER — Other Ambulatory Visit: Payer: Self-pay

## 2022-04-27 DIAGNOSIS — R009 Unspecified abnormalities of heart beat: Secondary | ICD-10-CM

## 2022-04-29 DIAGNOSIS — R009 Unspecified abnormalities of heart beat: Secondary | ICD-10-CM

## 2022-05-22 ENCOUNTER — Other Ambulatory Visit: Payer: Self-pay | Admitting: Cardiology

## 2022-05-22 DIAGNOSIS — I480 Paroxysmal atrial fibrillation: Secondary | ICD-10-CM

## 2022-07-13 ENCOUNTER — Ambulatory Visit: Payer: Commercial Managed Care - HMO | Admitting: Pulmonary Disease

## 2022-07-13 ENCOUNTER — Encounter: Payer: Self-pay | Admitting: Pulmonary Disease

## 2022-07-13 VITALS — BP 126/82 | HR 64 | Ht 63.0 in | Wt 364.0 lb

## 2022-07-13 DIAGNOSIS — R059 Cough, unspecified: Secondary | ICD-10-CM

## 2022-07-13 DIAGNOSIS — J189 Pneumonia, unspecified organism: Secondary | ICD-10-CM

## 2022-07-13 DIAGNOSIS — Z87891 Personal history of nicotine dependence: Secondary | ICD-10-CM | POA: Diagnosis not present

## 2022-07-13 DIAGNOSIS — R0602 Shortness of breath: Secondary | ICD-10-CM

## 2022-07-13 MED ORDER — ANORO ELLIPTA 62.5-25 MCG/ACT IN AEPB: 1.0000 | INHALATION_SPRAY | Freq: Every day | RESPIRATORY_TRACT | 0 refills | Status: DC

## 2022-07-13 NOTE — Addendum Note (Signed)
Addended by: Maurene Capes on: 07/13/2022 05:09 PM   Modules accepted: Orders

## 2022-07-13 NOTE — Addendum Note (Signed)
Addended by: Maurene Capes on: 07/13/2022 04:56 PM   Modules accepted: Orders

## 2022-07-13 NOTE — Progress Notes (Signed)
Synopsis: Referred in August 2023 for recurrent pneumonia  Subjective:   PATIENT ID: Nancy Wu GENDER: female DOB: [birth year], MRN: [id number]   HPI  Chief Complaint  Patient presents with   Consult    Referred by PCP for recurrent PNA. Had PNA again last month. Also had COVID in September 2022. Productive cough with greenish, yellowish phlegm. Increased wheezing.     Nancy Wu is here to see me for recurrent pneumonia > in July she had a dry cough that lasted for weeks > she later developed dyspnea, hypoxemia to the 80's and was sent by her PCP to the hospital > she was hospitalized for pneumonia and treated with steroids and antibiotics > she is still having a cough and dyspnea  She says that she had COVID 07/2021 and has been sick since then.  Normal childhood, no respiratory illnesses.  She used to smoke cigarettes for 25 years, 2ppd.  Quit in 2008 cold [place].  She used to have recurrent pneumonia when she smoked.    No weight loss or hemoptysis.    No changes in the environment, moved 1.5 years ago.  Sometimes has dust in the house.  No mold, mildew, water damage.    Cough: > still has it > she can feel a rattle in her chest > sometimes she'll produce yellow mucus in the evenings > laying flat makes the cough worse > no sinus congestion or post nasal drip > no heart burn (on protonix)  Dyspnea > She'll feel short of breath walking to the bathroom at home > it is slowly getting better, but she'll feel it when she walks to the mailbox > sometimes struggles walking to grocery store > can't carry in groceries  CHF: > she is currently 3 pounds overweight from leg swelling, lasix helps with this  Records from her cardiology visit in April 2023 with Dr. Dulce Sellar reviewed where she was treated for paroxysmal A-fib with Eliquis, hypertension was treated with Lisinopril/HCTZ combo, diuresing with furosemide for leg swelling.  Records from her primary care physician  were sent in consultation.  They note that in late 2020 she had a CT scan of her chest which showed no evidence of pulmonary embolism but there was right greater than left airspace disease in the base.  In July 2023 she had a respiratory viral panel which appears to be completely negative, July 2023 she had an ABG: 7.4 7/45/64/33  Past Medical History:  Diagnosis Date   Acute pharyngitis due to other specified organisms 10/14/2020   ALLERGIC RHINITIS 08/20/2007   Qualifier: Diagnosis of  By: Daphine Deutscher FNP, [place]     Anxiety    Borderline diabetes    Chest pain 05/08/2013   Normal coronary arteriography in November 2017   Chronic anticoagulation 09/26/2017   Chronic low back pain 01/25/2007   Qualifier: Diagnosis of  By: Bradly Bienenstock     Depression    Dyspnea 02/12/2021   Essential hypertension 01/25/2007   Qualifier: Diagnosis of  By: Bradly Bienenstock     Gastroesophageal reflux disease without esophagitis 09/29/2016   Heart disease    afib   High risk medication use 11/02/2016   Overview:  Multaq   HYPERTENSION, BENIGN SYSTEMIC 01/25/2007   Qualifier: Diagnosis of  By: Bradly Bienenstock     Hypertensive heart disease without heart failure 09/29/2016   Ingrown toenail 10/21/2012   IUD 2007   Lumbago with sciatica of left side    Migraine 07/22/2011   Morbid obesity (HCC)  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HCC) 11/14/2017   Obstructive sleep apnea treated with continuous positive airway pressure (CPAP) 02/12/2018   Palpitations 02/12/2021   Paroxysmal atrial fibrillation (HCC) 09/29/2016   PRESENCE OF IUD 11/28/2004   Qualifier: Diagnosis of  By: Daphine Deutscher FNP, Nykedtra     Reactive depression 01/25/2007   Qualifier: Diagnosis of  By: Bradly Bienenstock     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   Right knee meniscal tear 12/15/2011   Sciatica 01/25/2007   Qualifier: Diagnosis of  By: Bradly Bienenstock     Strain of left hamstring 06/01/2019      Family History  Problem Relation Age of Onset   Depression Mother    Hypertension Mother    Stroke Father    Hypertension Father    Melanoma Father    Lymphoma Father    Heart failure Father    Depression Sister    Alcohol abuse Brother    Cancer Other    Hypertension Other    Stroke Other    Heart disease Other    Diabetes Other      Social History   Socioeconomic History   Marital status: Married    Spouse name: Not on file   Number of children: Not on file   Years of education: Not on file   Highest education level: Not on file  Occupational History   Not on file  Tobacco Use   Smoking status: Former    Passive exposure: Past   Smokeless tobacco: Never  Vaping Use   Vaping Use: Never used  Substance and Sexual Activity   Alcohol use: Yes    Comment: rare   Drug use: No   Sexual activity: Not Currently    Partners: Male    Birth control/protection: I.U.D.  Other Topics Concern   Not on file  Social History Narrative   Right handed   Caffeine use: 2 cups coffee per day, 2 sodas per day.   Social Determinants of Health   Financial Resource Strain: Not on file  Food Insecurity: Not on file  Transportation Needs: Not on file  Physical Activity: Not on file  Stress: Not on file  Social Connections: Not on file  Intimate Partner Violence: Not on file     Allergies  Allergen Reactions   Codeine Nausea Only    REACTION: sick to stomach     Outpatient Medications Prior to Visit  Medication Sig Dispense Refill   apixaban (ELIQUIS) 5 MG TABS tablet Take 1 tablet by mouth twice daily 120 tablet 1   buPROPion (WELLBUTRIN XL) 300 MG 24 hr tablet Take 300 mg by mouth daily.     dronedarone (MULTAQ) 400 MG tablet Take 1 tablet (400 mg total) by mouth 2 (two) times daily with a meal. 180 tablet 3   furosemide (LASIX) 40 MG tablet Take 1 tablet (40 mg total) by mouth 2 (two) times daily. 180 tablet 1   gabapentin (NEURONTIN) 300 MG capsule Take 300 mg by  mouth daily as needed.     hydrALAZINE (APRESOLINE) 25 MG tablet Take 25 mg by mouth 3 (three) times daily.     metoprolol succinate (TOPROL-XL) 25 MG 24 hr tablet Take 0.5 tablets (12.5 mg total) by mouth daily. 45 tablet 3   pantoprazole (PROTONIX) 40 MG tablet Take 1 tablet by mouth daily.     gabapentin (NEURONTIN) 800 MG tablet Take 800 mg by mouth 3 (three) times  daily.     lisinopril-hydrochlorothiazide (ZESTORETIC) 20-12.5 MG tablet Take 1 tablet by mouth daily. 90 tablet 3   No facility-administered medications prior to visit.    Review of Systems  Constitutional:  Negative for chills, fever, malaise/fatigue and weight loss.  HENT:  Negative for congestion, nosebleeds, sinus pain and sore throat.   Eyes:  Negative for photophobia, pain and discharge.  Respiratory:  Positive for cough, sputum production and shortness of breath. Negative for hemoptysis and wheezing.   Cardiovascular:  Negative for chest pain, palpitations, orthopnea and leg swelling.  Gastrointestinal:  Negative for abdominal pain, constipation, diarrhea, nausea and vomiting.  Genitourinary:  Negative for dysuria, frequency, hematuria and urgency.  Musculoskeletal:  Negative for back pain, joint pain, myalgias and neck pain.  Skin:  Negative for itching and rash.  Neurological:  Negative for tingling, tremors, sensory change, speech change, focal weakness, seizures, weakness and headaches.  Psychiatric/Behavioral:  Negative for memory loss, substance abuse and suicidal ideas. The patient is not nervous/anxious.       Objective:  Physical Exam   Vitals:   07/13/22 1603  BP: 126/82  Pulse: 64  SpO2: 99%  Weight: (!) 364 lb (165.1 kg)  Height: 5\' 3"  (1.6 m)   Gen: obese but well appearing, some cough HENT: NCAT, OP clear, neck supple without masses Eyes: PERRL, EOMi Lymph: no cervical lymphadenopathy PULM: CTA B CV: RRR, no mgr, no JVD GI: BS+, soft, nontender, no hsm Derm: no rash or skin  breakdown MSK: normal bulk and tone Neuro: A&Ox4, CN II-XII intact, strength 5/5 in all 4 extremities Derm: some leg swelling at ankles Psyche: normal mood and affect    CBC    Component Value Date/Time   WBC 8.3 10/30/2021 0342   RBC 4.36 10/30/2021 0342   HGB 11.3 (L) 10/30/2021 0342   HCT 37.3 10/30/2021 0342   PLT 461 (H) 10/30/2021 0342   MCV 85.6 10/30/2021 0342   MCH 25.9 (L) 10/30/2021 0342   MCHC 30.3 10/30/2021 0342   RDW 15.6 (H) 10/30/2021 0342   LYMPHSABS 1.8 10/29/2021 0041   MONOABS 0.6 10/29/2021 0041   EOSABS 0.1 10/29/2021 0041   BASOSABS 0.0 10/29/2021 0041     Chest imaging: December 2022 CT angiogram chest images independently reviewed showing some mosaicism bilaterally, cardiomegaly, nonspecific groundglass findings in left upper lobe, scant small radiology felt the pattern was consistent with pulmonary edema, trace mediastinal adenopathy on the right July 2023 CT angiogram chest report: No evidence of PE, large groundglass opacity in the anterior aspect of the right lower lobe as well as multiple nodular opacities in the right upper lobe concerning for pneumonia.  PFT: 06/2022 spirometry> could not perform due to software malfunction  Labs: 10/2021 Hgb 11.3 gm/dL July 2023 she had an ABG: 7.4 7/45/64/33 July 2023 White blood cell count 14.0, hemoglobin 10.0 g/dL  Path:  Echo:  Heart Catheterization:       Assessment & Plan:   No diagnosis found.  Discussion: Diamond comes to our office today for evaluation of recurrent pneumonia.  She has a heavy smoking history and it is highly likely that she has COPD but unfortunately we were unable to test for that today in the office due to a software update.  I do not think she has any features which are suggestive of an opportunistic or atypical organism and its entirely possible that the symptoms she continues to experience are related to the natural history of pneumonia.  Most people will  still have a  cough and shortness of breath 1 month after their diagnosis.  However, considering her smoking history we need to follow the radiographic findings for 3 months to ensure that they resolve and provide better symptomatic therapy for her cough and congestion   Pneumonia: Your cough and mucus production are consistent with the normal course of pneumonia Take over-the-counter Robitussin 600 mg twice a day Take over-the-counter Delsym twice a day as needed for cough We will sample mucus to make sure that were not dealing with an atypical organism: Provide [place] with a sample of your mucus and will tested for bacterial, fungal, AFB cultures You need a follow up CT chest in October 2023 to make sure this has resolved  Smoker with persistent cough and shortness of breath: Spirometry today could not be performed due to a software problem I am worried you may have COPD based on your smoking history Try taking Anoro 1 puff daily no matter how you feel and let me know if this helps her breathing After taking Anoro rinse her mouth Spirometry next visit  Former cigarette smoker 15 years out from quitting, congratulated on quitting today  We will plan on seeing you back in 2 months after the CT scan.  At that point we will perform spirometry testing in the office   Current Outpatient Medications:    apixaban (ELIQUIS) 5 MG TABS tablet, Take 1 tablet by mouth twice daily, Disp: 120 tablet, Rfl: 1   buPROPion (WELLBUTRIN XL) 300 MG 24 hr tablet, Take 300 mg by mouth daily., Disp: , Rfl:    dronedarone (MULTAQ) 400 MG tablet, Take 1 tablet (400 mg total) by mouth 2 (two) times daily with a meal., Disp: 180 tablet, Rfl: 3   furosemide (LASIX) 40 MG tablet, Take 1 tablet (40 mg total) by mouth 2 (two) times daily., Disp: 180 tablet, Rfl: 1   gabapentin (NEURONTIN) 300 MG capsule, Take 300 mg by mouth daily as needed., Disp: , Rfl:    hydrALAZINE (APRESOLINE) 25 MG tablet, Take 25 mg by mouth 3 (three) times  daily., Disp: , Rfl:    metoprolol succinate (TOPROL-XL) 25 MG 24 hr tablet, Take 0.5 tablets (12.5 mg total) by mouth daily., Disp: 45 tablet, Rfl: 3   pantoprazole (PROTONIX) 40 MG tablet, Take 1 tablet by mouth daily., Disp: , Rfl:

## 2022-07-13 NOTE — Patient Instructions (Signed)
Pneumonia: Your cough and mucus production are consistent with the normal course of pneumonia Take over-the-counter Robitussin 600 mg twice a day Take over-the-counter Delsym twice a day as needed for cough We will sample mucus to make sure that were not dealing with an atypical organism: Provide [place] with a sample of your mucus and will tested for bacterial, fungal, AFB cultures You need a follow up CT chest in October 2023 to make sure this has resolved  Smoker with persistent cough and shortness of breath: Spirometry today could not be performed due to a software problem I am worried you may have COPD based on your smoking history Try taking Anoro 1 puff daily no matter how you feel and let me know if this helps her breathing After taking Anoro rinse her mouth Spirometry next visit   We will plan on seeing you back in 2 months after the CT scan.  At that point we will perform spirometry testing in the office

## 2022-07-27 ENCOUNTER — Telehealth: Payer: Self-pay | Admitting: Pulmonary Disease

## 2022-07-28 MED ORDER — ANORO ELLIPTA 62.5-25 MCG/ACT IN AEPB
1.0000 | INHALATION_SPRAY | Freq: Every day | RESPIRATORY_TRACT | 11 refills | Status: AC
Start: 2022-07-28 — End: ?

## 2022-07-28 NOTE — Telephone Encounter (Signed)
Spoke with the pt  She states that the anoro has helped her breathing significantly  I have sent prescription her to her pharmacy Nothing further needed

## 2022-07-29 ENCOUNTER — Other Ambulatory Visit: Payer: Self-pay

## 2022-07-29 ENCOUNTER — Telehealth: Payer: Self-pay | Admitting: Cardiology

## 2022-07-29 MED ORDER — FUROSEMIDE 40 MG PO TABS: 40.0000 mg | ORAL_TABLET | Freq: Two times a day (BID) | ORAL | 3 refills | Status: DC

## 2022-07-29 MED ORDER — METOPROLOL SUCCINATE ER 25 MG PO TB24: 12.5000 mg | ORAL_TABLET | Freq: Every day | ORAL | 3 refills | Status: DC

## 2022-07-29 NOTE — Telephone Encounter (Signed)
Refills sent to [facility] for Furosemide and Metoprolol Succinate as requested

## 2022-07-29 NOTE — Telephone Encounter (Signed)
Pt c/o medication issue:  1. Name of Medication: dronedarone (MULTAQ) 400 MG tablet  2. How are you currently taking this medication (dosage and times per day)? Take 1 tablet (400 mg total) by mouth 2 (two) times daily with a meal.  3. Are you having a reaction (difficulty breathing--STAT)?   4. What is your medication issue? Pt spouse called stating pt was supposed to come pick up this medication but she forgot. He wants to know if she can pick them up next Friday 08/05/22

## 2022-08-02 NOTE — Telephone Encounter (Signed)
Patient called and informed that we have the Multaq medication for her and she is able to come in on Friday 08/05/22 and pick it up. Patient had no further questions at this time.

## 2022-08-18 ENCOUNTER — Telehealth: Payer: Self-pay

## 2022-08-18 DIAGNOSIS — I480 Paroxysmal atrial fibrillation: Secondary | ICD-10-CM

## 2022-08-18 MED ORDER — APIXABAN 5 MG PO TABS: 5.0000 mg | ORAL_TABLET | Freq: Two times a day (BID) | ORAL | 3 refills | Status: DC

## 2022-08-18 NOTE — Telephone Encounter (Signed)
Request Eliquis 5 mg refills clarification  be sent to [facility] , [place], [place] [address]

## 2022-08-18 NOTE — Telephone Encounter (Signed)
Prescription refill request for Eliquis received. Indication: Afib  Last office visit: 03/22/22 [facility]  Scr: 1.02 (12/15/21)  Age: 56 Weight: 165.1kg  Appropriate dose and refill sent to requested pharmacy.

## 2022-08-30 ENCOUNTER — Ambulatory Visit
Admission: RE | Admit: 2022-08-30 | Discharge: 2022-08-30 | Disposition: A | Discharge status: Home / Self Care | Payer: Commercial Managed Care - HMO | Source: Ambulatory Visit | Attending: Pulmonary Disease | Admitting: Pulmonary Disease

## 2022-08-30 DIAGNOSIS — J189 Pneumonia, unspecified organism: Secondary | ICD-10-CM

## 2022-09-02 NOTE — Progress Notes (Signed)
Synopsis: Referred in August 2023 for recurrent pneumonia in a former smoker. Smoked 2ppd for 25 years, quit 2008  Subjective:   PATIENT ID: Nancy Wu GENDER: female DOB: [birth year], MRN: [id number]   HPI  Chief Complaint  Patient presents with   Follow-up    Ct results Flu vaccine   Nancy Wu has been doing great since last visit.  Her shortness of breath has resolved and she is now able to walk completely around a large store like [employer] without having difficulty breathing.  She says the Anoro has really made a lot of difference.  She continues to cough and does produce some mucus on a daily basis.  No fevers or chills.  She has not had to use albuterol.  Past Medical History:  Diagnosis Date   Acute pharyngitis due to other specified organisms 10/14/2020   ALLERGIC RHINITIS 08/20/2007   Qualifier: Diagnosis of  By: Hassell Done FNP, Nykedtra     Anxiety    Borderline diabetes    Chest pain 05/08/2013   Normal coronary arteriography in November 2017   Chronic anticoagulation 09/26/2017   Chronic low back pain 01/25/2007   Qualifier: Diagnosis of  By: Beryle Lathe     Depression    Dyspnea 02/12/2021   Essential hypertension 01/25/2007   Qualifier: Diagnosis of  By: Beryle Lathe     Gastroesophageal reflux disease without esophagitis 09/29/2016   Heart disease    afib   High risk medication use 11/02/2016   Overview:  Multaq   HYPERTENSION, BENIGN SYSTEMIC 01/25/2007   Qualifier: Diagnosis of  By: Beryle Lathe     Hypertensive heart disease without heart failure 09/29/2016   Ingrown toenail 10/21/2012   IUD 2007   Lumbago with sciatica of left side    Migraine 07/22/2011   Morbid obesity ([place])    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[place]) 11/14/2017   Obstructive sleep apnea treated with continuous positive airway pressure (CPAP) 02/12/2018   Palpitations 02/12/2021   Paroxysmal atrial fibrillation ([place]) 09/29/2016    PRESENCE OF IUD 11/28/2004   Qualifier: Diagnosis of  By: Hassell Done FNP, Nykedtra     Reactive depression 01/25/2007   Qualifier: Diagnosis of  By: Beryle Lathe     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  HENT:  Negative for ear discharge, hearing loss and sore throat.   Respiratory:  Positive for cough and sputum production. Negative for shortness of breath, wheezing and stridor.      Objective:  Physical Exam   Vitals:   09/06/22 1133  BP: 108/66  Pulse: 61  Temp: (!) 97.2 F (36.2 C)  TempSrc: Oral  SpO2: 96%  Weight: (!) 330 lb 6.4 oz (149.9 kg)  Height: 5\' 3"  (1.6 m)   Gen: well appearing HENT: OP clear, TM's clear, neck supple PULM: CTA B, normal percussion CV: RRR, no mgr, trace edema GI: BS+, soft, nontender Derm: no cyanosis or rash Psyche: normal mood and affect   CBC    Component Value Date/Time   WBC 8.3 10/30/2021 0342   RBC 4.36 10/30/2021 0342   HGB 11.3 (L) 10/30/2021 0342   HCT 37.3 10/30/2021 0342   PLT 461 (H) 10/30/2021 0342  MCV 85.6 10/30/2021 0342   MCH 25.9 (L) 10/30/2021 0342   MCHC 30.3 10/30/2021 0342   RDW 15.6 (H) 10/30/2021 0342   LYMPHSABS 1.8 10/29/2021 0041   MONOABS 0.6 10/29/2021 0041   EOSABS 0.1 10/29/2021 0041   BASOSABS 0.0 10/29/2021 0041     Chest imaging: December 2022 CT angiogram chest images independently reviewed showing some mosaicism bilaterally, cardiomegaly, nonspecific groundglass findings in left upper lobe, scant small radiology felt the pattern was consistent with pulmonary edema, trace mediastinal adenopathy on the right July 2023 CT angiogram chest report: No evidence of PE, large groundglass opacity in the anterior aspect of the right lower lobe as well as  multiple nodular opacities in the right upper lobe concerning for pneumonia. October 2023 CT chest images independently reviewed showing clearing of the right lower lobe pneumonia, scattered bilateral lower lobe pulmonary nodules, largest is 6 mm  PFT: 06/2022 spirometry> could not perform due to software malfunction 08/2022 spirometry ratio 85%, FEV1 2.02 L 78% predicted  Labs: 10/2021 Hgb 11.3 gm/dL July 2023 she had an ABG: 7.4 7/45/64/33 July 2023 White blood cell count 14.0, hemoglobin 10.0 g/dL  Path:  Echo:  Heart Catheterization:       Assessment & Plan:   Pneumonia of right lower lobe due to infectious organism - Plan: Spirometry with graph  Former cigarette smoker  Pulmonary nodules  Discussion: Her pneumonia completely cleared up on the most recent CT scan which is good.  However, she has nodules which need to be following given her prior smoking history.  She has brought chronic bronchitis but fortunately no airflow obstruction.  Chronic bronchitis without airflow obstruction: Keep taking Anoro 1 puff daily no matter how you feel Use albuterol as needed for chest tightness wheezing or shortness of breath Practice good hand hygiene Stay physically active Flu shot today Repeat pneumonia vaccine when he turns 66  Pulmonary nodule: Repeat CT chest in 6 months  We will see you back in 6 months or sooner if needed  Immunization History  Administered Date(s) Administered   Influenza Split 11/16/2011   Influenza,inj,Quad PF,6+ Mos 09/30/2016   Influenza-Unspecified 11/16/2011, 11/19/2014, 09/30/2016, 09/25/2017   Pneumococcal Polysaccharide-23 11/19/2014   Td 03/29/2003   Td (Adult),5 Lf Tetanus Toxid, Preservative Free 03/29/2003      Current Outpatient Medications:    apixaban (ELIQUIS) 5 MG TABS tablet, Take 1 tablet (5 mg total) by mouth 2 (two) times daily., Disp: 180 tablet, Rfl: 3   buPROPion (WELLBUTRIN XL) 300 MG 24 hr tablet, Take 300 mg by  mouth daily., Disp: , Rfl:    dronedarone (MULTAQ) 400 MG tablet, Take 1 tablet (400 mg total) by mouth 2 (two) times daily with a meal., Disp: 180 tablet, Rfl: 3   furosemide (LASIX) 40 MG tablet, Take 1 tablet (40 mg total) by mouth 2 (two) times daily., Disp: 180 tablet, Rfl: 3   gabapentin (NEURONTIN) 300 MG capsule, Take 300 mg by mouth daily as needed., Disp: , Rfl:    hydrALAZINE (APRESOLINE) 25 MG tablet, Take 25 mg by mouth 3 (three) times daily., Disp: , Rfl:    losartan (COZAAR) 50 MG tablet, Take 50 mg by mouth daily., Disp: , Rfl:    metoprolol succinate (TOPROL-XL) 25 MG 24 hr tablet, Take 0.5 tablets (12.5 mg total) by mouth daily., Disp: 45 tablet, Rfl: 3   pantoprazole (PROTONIX) 40 MG tablet, Take 1 tablet by mouth daily., Disp: , Rfl:    traZODone (DESYREL) 100 MG tablet,  Take 100 mg by mouth at bedtime as needed., Disp: , Rfl:    umeclidinium-vilanterol (ANORO ELLIPTA) 62.5-25 MCG/ACT AEPB, Inhale 1 puff into the lungs daily., Disp: 2 each, Rfl: 0   umeclidinium-vilanterol (ANORO ELLIPTA) 62.5-25 MCG/ACT AEPB, Inhale 1 puff into the lungs daily., Disp: 60 each, Rfl: 11

## 2022-09-06 ENCOUNTER — Encounter: Payer: Self-pay | Admitting: Pulmonary Disease

## 2022-09-06 ENCOUNTER — Ambulatory Visit (INDEPENDENT_AMBULATORY_CARE_PROVIDER_SITE_OTHER): Payer: Commercial Managed Care - HMO | Admitting: Pulmonary Disease

## 2022-09-06 VITALS — BP 108/66 | HR 61 | Temp 97.2°F | Ht 63.0 in | Wt 330.4 lb

## 2022-09-06 DIAGNOSIS — J189 Pneumonia, unspecified organism: Secondary | ICD-10-CM | POA: Diagnosis not present

## 2022-09-06 DIAGNOSIS — R918 Other nonspecific abnormal finding of lung field: Secondary | ICD-10-CM | POA: Diagnosis not present

## 2022-09-06 DIAGNOSIS — Z87891 Personal history of nicotine dependence: Secondary | ICD-10-CM | POA: Diagnosis not present

## 2022-09-06 DIAGNOSIS — Z23 Encounter for immunization: Secondary | ICD-10-CM | POA: Diagnosis not present

## 2022-09-06 NOTE — Patient Instructions (Signed)
Chronic bronchitis without airflow obstruction: Keep taking Anoro 1 puff daily no matter how you feel Use albuterol as needed for chest tightness wheezing or shortness of breath Practice good hand hygiene Stay physically active Flu shot today Repeat pneumonia vaccine when he turns 66  Pulmonary nodule: Repeat CT chest in 6 months  We will see you back in 6 months or sooner if needed

## 2022-09-06 NOTE — Addendum Note (Signed)
Addended by: Valerie Salts on: 09/06/2022 12:20 PM   Modules accepted: Orders

## 2022-09-20 ENCOUNTER — Ambulatory Visit: Payer: Commercial Managed Care - HMO | Admitting: Cardiology

## 2022-10-12 ENCOUNTER — Other Ambulatory Visit: Payer: Self-pay

## 2022-10-12 NOTE — Progress Notes (Unsigned)
Cardiology Office Note:    Date:  10/13/2022   ID:  Nancy Wu, DOB 1966/09/06, MRN [id number]  PCP:  Julianne Handler, NP  Cardiologist:  Norman Herrlich, MD    Referring MD: Julianne Handler, NP    ASSESSMENT:    1. Paroxysmal atrial fibrillation (HCC)   2. High risk medication use   3. Chronic anticoagulation   4. Hypertensive heart disease with chronic diastolic congestive heart failure (HCC)   5. Hypokalemia    PLAN:    In order of problems listed above:  Overall doing well maintaining sinus rhythm I told her she needs to be able to check her own heart rhythm at home and encouraged her to purchase the mobile Kardia device continue doing at her own and her anticoagulant Stable blood pressure is at target much improved with her weight loss and will list amlodipine as an allergy continue hydralazine losartan and her loop diuretic Continue potassium supplements in my experience these individuals taking a loop diuretic with significant hypokalemia need to take a supplement for life.   Next appointment: Should be seen in 9 months for follow-up for atrial arrhythmia   Medication Adjustments/Labs and Tests Ordered: Current medicines are reviewed at length with the patient today.  Concerns regarding medicines are outlined above.  No orders of the defined types were placed in this encounter.  No orders of the defined types were placed in this encounter.  Chief complaint follow-up atrial fibrillation   History of Present Illness:    Nancy Wu is a 56 y.o. female with a hx of paroxysmal atrial fibrillation maintaining sinus rhythm on Multaq anticoagulated morbid obesity obstructive sleep apnea and heart failure with normal ejection fraction last seen 03/22/2022.  She was admitted to [facility] 10/27/2021 discharged 10/30/2021 with admission diagnosis of acute heart failure echocardiogram showed continued preserved ejection fraction 55 to 60% heart failure is  attributed to IV fluid loading during private hospitalization at [facility] with appendicitis and systemic inflammatory response syndrome and subsequently had acute deterioration in kidney function related to diuresis.  She is admitted to [facility] July the discharge summary list her primary diagnosis is hypokalemia but the discussion and summary said that she was admitted to the hospital with pneumonia.  Discharge labs showed a potassium of 4.6 creatinine 0.8 white count elevated 14,000 she is anemic with a hemoglobin of 10.0 and platelets were normal at 409,000.  Although one of her diagnoses is congestive heart failure she did not have a proBNP level checked.  CT scan showed right upper and right lower lobe infiltrate consistent with pneumonia.  Chest x-ray showed sinus rhythm left axis deviation nonspecific ST and T abnormality.  Her initial potassium was 2.9 in the emergency room.  Compliance with diet, lifestyle and medications: Yes  She had a very significant weight loss likely in excess of 50 pounds She has recovered from her hospitalization for multilobar pneumonia and is following up with pulmonary Dr. Kendrick Fries Her breathing is improved but still has mild exertional shortness of breath and despite weight loss still uses CPAP for obstructive sleep apnea Home blood pressure runs at goal less than 130/80 He had follow-up labs for hypokalemia and takes a potassium supplement Few weeks ago she had 1 episode of probable recurrent atrial fibrillation self terminated I have asked her to purchase the mobile Kardia to capture episodes and also to screen at least several times a week for recurrent atrial fibrillation She tolerates her anticoagulant without clinical  bleeding He was taken off amlodipine her edema has resolved No chest pain or syncope Past Medical History:  Diagnosis Date   Acute heart failure (HCC) 10/28/2021   Acute medial meniscus tear of left knee 07/14/2019   Acute  on chronic diastolic congestive heart failure (HCC) 03/14/2022   Acute pharyngitis due to other specified organisms 10/14/2020   Aftercare following surgery of the musculoskeletal system 09/27/2019   ALLERGIC RHINITIS 08/20/2007   Qualifier: Diagnosis of  By: Daphine Deutscher FNP, Nykedtra     Anxiety    Borderline diabetes    Chest pain 05/08/2013   Normal coronary arteriography in November 2017   Chronic anticoagulation 09/26/2017   Chronic low back pain 01/25/2007   Qualifier: Diagnosis of  By: Bradly Bienenstock     CPAP (continuous positive airway pressure) dependence 03/14/2022   Depression    Dyspnea 02/12/2021   Essential hypertension 01/25/2007   Qualifier: Diagnosis of  By: Bradly Bienenstock     Gastroesophageal reflux disease without esophagitis 09/29/2016   Heart disease    afib   High risk medication use 11/02/2016   Overview:  Multaq   HYPERTENSION, BENIGN SYSTEMIC 01/25/2007   Qualifier: Diagnosis of  By: Bradly Bienenstock     Hypertensive heart disease without heart failure 09/29/2016   Ingrown toenail 10/21/2012   Lipid screening 07/16/2019   Migraine 07/22/2011   Morbid obesity (HCC)    Morbid obesity with body mass index (BMI) of 50.0 to 59.9 in adult (HCC) 11/14/2017   Obesity 01/25/2007   Qualifier: Diagnosis of   By: Bradly Bienenstock       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HCC) 11/14/2017   Obstructive sleep apnea treated with continuous positive airway pressure (CPAP) 02/12/2018   Palpitations 02/12/2021   Paroxysmal atrial fibrillation (HCC) 09/29/2016   PRESENCE OF IUD 11/28/2004   Qualifier: Diagnosis of  By: Daphine Deutscher FNP, Nykedtra     Reactive depression 01/25/2007   Qualifier: Diagnosis of  By: Bradly Bienenstock     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   Right knee meniscal tear 12/15/2011   Sciatica 01/25/2007   Qualifier: Diagnosis of  By: Bradly Bienenstock     Sleeps in sitting position due to orthopnea  03/14/2022   Strain of left hamstring 06/01/2019   Super obesity 03/14/2022    Past Surgical History:  Procedure Laterality Date   APPENDECTOMY     CARDIAC CATHETERIZATION Bilateral 09/29/2016   [place]   CESAREAN SECTION  1989   CHOLECYSTECTOMY  2010   KNEE SURGERY Bilateral    TUBAL LIGATION  1989    Current Medications: Current Meds  Medication Sig   apixaban (ELIQUIS) 5 MG TABS tablet Take 1 tablet (5 mg total) by mouth 2 (two) times daily.   buPROPion (WELLBUTRIN XL) 300 MG 24 hr tablet Take 300 mg by mouth daily.   dronedarone (MULTAQ) 400 MG tablet Take 1 tablet (400 mg total) by mouth 2 (two) times daily with a meal.   furosemide (LASIX) 40 MG tablet Take 40 mg by mouth daily.   gabapentin (NEURONTIN) 300 MG capsule Take 300 mg by mouth daily as needed (pain).   hydrALAZINE (APRESOLINE) 25 MG tablet Take 25 mg by mouth 3 (three) times daily.   losartan (COZAAR) 50 MG tablet Take 50 mg by mouth daily.   metoprolol succinate (TOPROL-XL) 25 MG 24 hr tablet Take 0.5 tablets (12.5 mg total) by mouth daily.   pantoprazole (PROTONIX) 40 MG  tablet Take 1 tablet by mouth daily.   Potassium Chloride ER 20 MEQ TBCR Take 1 tablet by mouth daily.   traZODone (DESYREL) 100 MG tablet Take 100 mg by mouth at bedtime as needed for sleep.   umeclidinium-vilanterol (ANORO ELLIPTA) 62.5-25 MCG/ACT AEPB Inhale 1 puff into the lungs daily.   umeclidinium-vilanterol (ANORO ELLIPTA) 62.5-25 MCG/ACT AEPB Inhale 1 puff into the lungs daily.     Allergies:   Codeine   Social History   Socioeconomic History   Marital status: Married    Spouse name: Not on file   Number of children: Not on file   Years of education: Not on file   Highest education level: Not on file  Occupational History   Not on file  Tobacco Use   Smoking status: Former    Passive exposure: Past   Smokeless tobacco: Never  Vaping Use   Vaping Use: Never used  Substance and Sexual Activity   Alcohol use: Yes     Comment: rare   Drug use: No   Sexual activity: Not Currently    Partners: Male    Birth control/protection: I.U.D.  Other Topics Concern   Not on file  Social History Narrative   Right handed   Caffeine use: 2 cups coffee per day, 2 sodas per day.   Social Determinants of Health   Financial Resource Strain: Not on file  Food Insecurity: Not on file  Transportation Needs: Not on file  Physical Activity: Not on file  Stress: Not on file  Social Connections: Not on file     Family History: The patient's family history includes Alcohol abuse in her brother; Cancer in an other family member; Depression in her mother and sister; Diabetes in an other family member; Heart disease in an other family member; Heart failure in her father; Hypertension in her father, mother, and another family member; Lymphoma in her father; Melanoma in her father; Stroke in her father and another family member. ROS:   Please see the history of present illness.    All other systems reviewed and are negative.  EKGs/Labs/Other Studies Reviewed:    The following studies were reviewed today:   Recent Labs: 10/27/2021: B Natriuretic Peptide 248.9 10/29/2021: ALT 20; Magnesium 2.1; TSH 0.905 10/30/2021: Hemoglobin 11.3; Platelets 461 12/15/2021: BUN 17; Creatinine, Ser 1.02; NT-Pro BNP 318; Potassium 4.4; Sodium 139  Recent Lipid Panel    Component Value Date/Time   CHOL 155 04/22/2011 0900   TRIG 112 04/22/2011 0900   HDL 45 04/22/2011 0900   CHOLHDL 3.4 04/22/2011 0900   VLDL 22 04/22/2011 0900   LDLCALC 88 04/22/2011 0900   LDLDIRECT 80 05/08/2013 1636    Physical Exam:    VS:  BP 124/78   Pulse 72   Ht 5\' 3"  (1.6 m)   Wt (!) 321 lb 12.8 oz (146 kg)   SpO2 95%   BMI 57.00 kg/m     Wt Readings from Last 3 Encounters:  10/13/22 (!) 321 lb 12.8 oz (146 kg)  09/06/22 (!) 330 lb 6.4 oz (149.9 kg)  07/13/22 (!) 364 lb (165.1 kg)     GEN: Marked improvement in her appearance well nourished,  well developed in no acute distress HEENT: Normal NECK: No JVD; No carotid bruits LYMPHATICS: No lymphadenopathy CARDIAC: RRR, no murmurs, rubs, gallops RESPIRATORY:  Clear to auscultation without rales, wheezing or rhonchi  ABDOMEN: Soft, non-tender, non-distended MUSCULOSKELETAL:  No edema; No deformity  SKIN: Warm and dry NEUROLOGIC:  Alert and oriented x 3 PSYCHIATRIC:  Normal affect    Signed, Norman HerrlichBrian Tran Arzuaga, MD  10/13/2022 8:55 AM    [facility]

## 2022-10-13 ENCOUNTER — Encounter: Payer: Self-pay | Admitting: Cardiology

## 2022-10-13 ENCOUNTER — Ambulatory Visit: Payer: Commercial Managed Care - HMO | Attending: Cardiology | Admitting: Cardiology

## 2022-10-13 VITALS — BP 124/78 | HR 72 | Ht 63.0 in | Wt 321.8 lb

## 2022-10-13 DIAGNOSIS — I480 Paroxysmal atrial fibrillation: Secondary | ICD-10-CM | POA: Diagnosis not present

## 2022-10-13 DIAGNOSIS — Z79899 Other long term (current) drug therapy: Secondary | ICD-10-CM | POA: Diagnosis not present

## 2022-10-13 DIAGNOSIS — I110 Hypertensive heart disease with heart failure: Secondary | ICD-10-CM | POA: Diagnosis not present

## 2022-10-13 DIAGNOSIS — Z7901 Long term (current) use of anticoagulants: Secondary | ICD-10-CM | POA: Diagnosis not present

## 2022-10-13 DIAGNOSIS — I5032 Chronic diastolic (congestive) heart failure: Secondary | ICD-10-CM

## 2022-10-13 DIAGNOSIS — E876 Hypokalemia: Secondary | ICD-10-CM

## 2022-10-13 NOTE — Patient Instructions (Signed)
Medication Instructions:  Your physician recommends that you continue on your current medications as directed. Please refer to the Current Medication list given to you today.  *If you need a refill on your cardiac medications before your next appointment, please call your pharmacy*   Lab Work: NONE If you have labs (blood work) drawn today and your tests are completely normal, you will receive your results only by: [place] (if you have MyChart) OR A paper copy in the mail If you have any lab test that is abnormal or we need to change your treatment, we will call you to review the results.   Testing/Procedures: NONE   Follow-Up: At [facility], you and your health needs are our priority.  As part of our continuing mission to provide you with exceptional heart care, we have created designated Provider Care Teams.  These Care Teams include your primary Cardiologist (physician) and Advanced Practice Providers (APPs -  Physician Assistants and Nurse Practitioners) who all work together to provide you with the care you need, when you need it.  We recommend signing up for the patient portal called "MyChart".  Sign up information is provided on this After Visit Summary.  MyChart is used to connect with patients for Virtual Visits (Telemedicine).  Patients are able to view lab/test results, encounter notes, upcoming appointments, etc.  Non-urgent messages can be sent to your provider as well.   To learn more about what you can do with MyChart, go to [email or web address].    Your next appointment:   9 month(s)  The format for your next appointment:   In Person  Provider:   Shirlee More, MD    Other Instructions Check and Record BP and bring log to office [place] and check at least 2-3 times per week  Important Information About Sugar      1. Avoid all over-the-counter antihistamines except Claritin/Loratadine and Zyrtec/Cetrizine. 2. Avoid all  combination including cold sinus allergies flu decongestant and sleep medications 3. You can use Robitussin DM Mucinex and Mucinex DM for cough. 4. can use Tylenol aspirin ibuprofen and naproxen but no combinations such as sleep or sinus.   KardiaMobile [email or web address]        FDA-cleared, clinical grade mobile EKG monitor: Jodelle Red is the most clinically-validated mobile EKG used by the world's leading cardiac care medical professionals With Basic service, know instantly if your heart rhythm is normal or if atrial fibrillation is detected, and email the last single EKG recording to yourself or your doctor Premium service, available for purchase through the Kardia app for $9.99 per month or $99 per year, includes unlimited history and storage of your EKG recordings, a monthly EKG summary report to share with your doctor, along with the ability to track your blood pressure, activity and weight Includes one KardiaMobile phone clip FREE SHIPPING: Standard delivery 1-3 business days. Orders placed by 11:00am PST will ship that afternoon. Otherwise, will ship next business day. All orders ship via [employer] from [place], Protection  Tips to measure your blood pressure correctly  To determine whether you have hypertension, a medical professional will take a blood pressure reading. How you prepare for the test, the position of your arm, and other factors can change a blood pressure reading by 10% or more. That could be enough to hide high blood pressure, start you on a drug you don't really need, or lead your doctor to incorrectly adjust your medications. National and international  guidelines offer specific instructions for measuring blood pressure. If a doctor, nurse, or medical assistant isn't doing it right, don't hesitate to ask him or her to get with the guidelines. Here's what you can do to ensure a correct reading:  Don't drink a caffeinated beverage  or smoke during the 30 minutes before the test.  Sit quietly for five minutes before the test begins.  During the measurement, sit in a chair with your feet on the floor and your arm supported so your elbow is at about heart level.  The inflatable part of the cuff should completely cover at least 80% of your upper arm, and the cuff should be placed on bare skin, not over a shirt.  Don't talk during the measurement.  Have your blood pressure measured twice, with a brief break in between. If the readings are different by 5 points or more, have it done a third time. There are times to break these rules. If you sometimes feel lightheaded when getting out of bed in the morning or when you stand after sitting, you should have your blood pressure checked while seated and then while standing to see if it falls from one position to the next. Because blood pressure varies throughout the day, your doctor will rarely diagnose hypertension on the basis of a single reading. Instead, he or she will want to confirm the measurements on at least two occasions, usually within a few weeks of one another. The exception to this rule is if you have a blood pressure reading of 180/110 mm Hg or higher. A result this high usually calls for prompt treatment. It's also a good idea to have your blood pressure measured in both arms at least once, since the reading in one arm (usually the right) may be higher than that in the left. A 2014 study in The American Journal of Medicine of nearly 3,400 people found average arm- to-arm differences in systolic blood pressure of about 5 points. The higher number should be used to make treatment decisions. In 2017, new guidelines from the American Heart Association, the [employer] of Cardiology, and nine other health organizations lowered the diagnosis of high blood pressure to 130/80 mm Hg or higher for all adults. The guidelines also redefined the various blood pressure categories to now  include normal, elevated, Stage 1 hypertension, Stage 2 hypertension, and hypertensive crisis (see "Blood pressure categories"). Blood pressure categories  Blood pressure category SYSTOLIC (upper number)  DIASTOLIC (lower number)  Normal Less than 120 mm Hg and Less than 80 mm Hg  Elevated 120-129 mm Hg and Less than 80 mm Hg  High blood pressure: Stage 1 hypertension 130-139 mm Hg or 80-89 mm Hg  High blood pressure: Stage 2 hypertension 140 mm Hg or higher or 90 mm Hg or higher  Hypertensive crisis (consult your doctor immediately) Higher than 180 mm Hg and/or Higher than 120 mm Hg  Source: American Heart Association and American Stroke Association. For more on getting your blood pressure under control, buy Controlling Your Blood Pressure, a Special Health Report from [facility].

## 2022-11-24 ENCOUNTER — Telehealth: Payer: Self-pay | Admitting: Cardiology

## 2022-11-24 NOTE — Telephone Encounter (Signed)
I received an alert that she is taking Paxlovid and Eliquis.  She has been taking Paxlovid for 2 days I told her to stop her Eliquis and not to restart until 72 hours after finishing Paxlovid in 5 more days.

## 2022-12-04 ENCOUNTER — Other Ambulatory Visit: Payer: Self-pay | Admitting: Cardiology

## 2023-01-24 DIAGNOSIS — N950 Postmenopausal bleeding: Secondary | ICD-10-CM

## 2023-01-24 HISTORY — DX: Postmenopausal bleeding: N95.0

## 2023-02-16 ENCOUNTER — Encounter: Payer: Self-pay | Admitting: Cardiology

## 2023-03-07 ENCOUNTER — Inpatient Hospital Stay: Admission: RE | Admit: 2023-03-07 | Payer: Commercial Managed Care - HMO | Source: Ambulatory Visit

## 2023-03-22 ENCOUNTER — Encounter: Payer: Self-pay | Admitting: Cardiology

## 2023-03-22 MED ORDER — DRONEDARONE HCL 400 MG PO TABS: 400.0000 mg | ORAL_TABLET | Freq: Two times a day (BID) | ORAL | 1 refills | Status: AC

## 2023-03-23 ENCOUNTER — Telehealth: Payer: Self-pay

## 2023-03-23 ENCOUNTER — Other Ambulatory Visit (HOSPITAL_COMMUNITY): Payer: Self-pay

## 2023-03-23 NOTE — Telephone Encounter (Signed)
Spoke with patient today regarding patient assistance application for Multaq. Patient states that she has completed the [employer] application and will bring by office. Once received will have provider sign and fax.

## 2023-03-23 NOTE — Telephone Encounter (Signed)
Are you going to start PAP application for Maltaq with [employer] for pt. I see it was sent as PA but it looks like she has gotten from the company just wanted to make sure before we started  PA.   Georga Bora [facility] Patient Advocate [phone]

## 2023-03-23 NOTE — Telephone Encounter (Signed)
-----   Message from Viviano Simas, CMA sent at 03/22/2023 12:37 PM EDT ----- PA needed for Multaq

## 2023-04-05 ENCOUNTER — Telehealth: Payer: Self-pay | Admitting: Cardiology

## 2023-04-05 NOTE — Telephone Encounter (Signed)
Lvm informing patient her medication is at the front desk in [place] ready for pick up.

## 2023-04-06 DIAGNOSIS — N950 Postmenopausal bleeding: Secondary | ICD-10-CM | POA: Diagnosis not present

## 2023-04-07 IMAGING — CR DG CHEST 1V
1 series · 1 of 1 positions shown · non-contrast
Comparison: 07/25/2021, 10/17/2020

CLINICAL DATA: Shortness of breath

EXAM:
CHEST  1 VIEW

[chest pa]
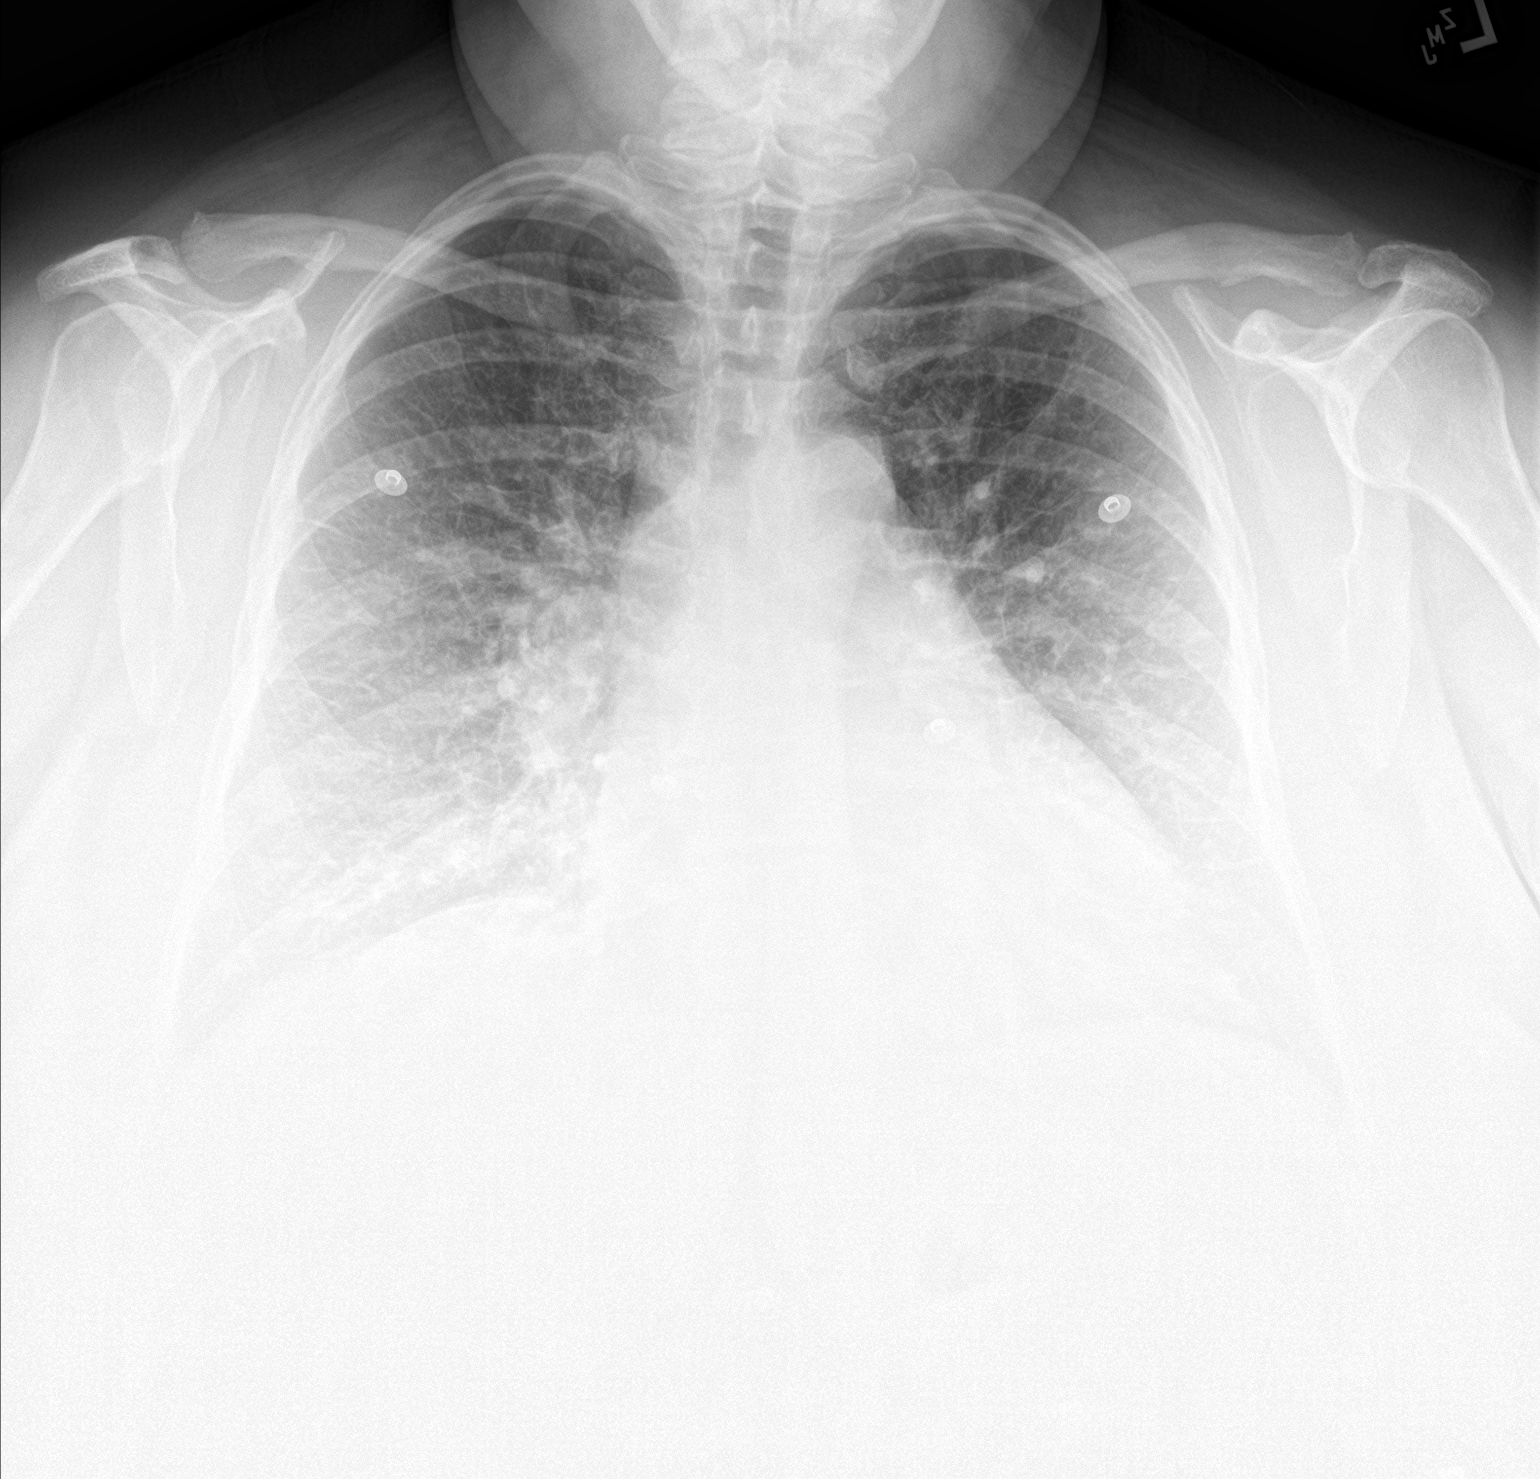

[1 of 1 positions shown; findings below may reference images not displayed]

FINDINGS: Cardiomegaly with vascular congestion and diffuse interstitial
opacity most consistent with edema. No pleural effusion or
pneumothorax.
IMPRESSION: Cardiomegaly with vascular congestion and interstitial pulmonary
edema.

## 2023-04-08 IMAGING — CT CT ANGIO CHEST
2 of 6 series · 17 of 46 positions shown · IV contrast (omnipaque)
Comparison: None.

CLINICAL DATA: High probability PE.  Short of breath.

EXAM:
CT ANGIOGRAPHY CHEST WITH CONTRAST
TECHNIQUE: Multidetector CT imaging of the chest was performed using the
standard protocol during bolus administration of intravenous
contrast. Multiplanar CT image reconstructions and MIPs were
obtained to evaluate the vascular anatomy.
CONTRAST:  70mL OMNIPAQUE IOHEXOL 350 MG/ML SOLN

[Series 7: thins · axial · 0.92mm/px · z∈[+1090,+1366]mm · 14 of 432 slices shown]
[im 19/432  lung]
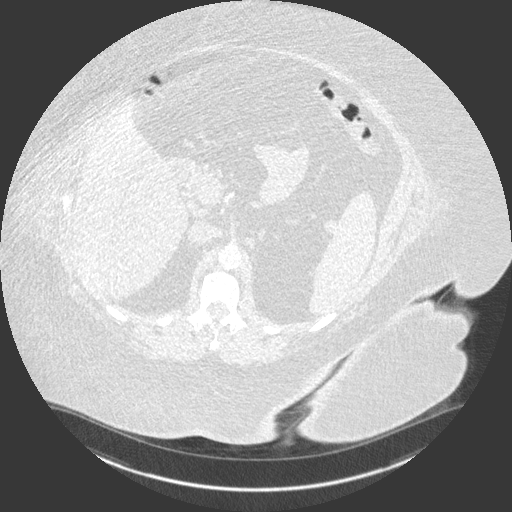
[im 57/432  soft-tissue]
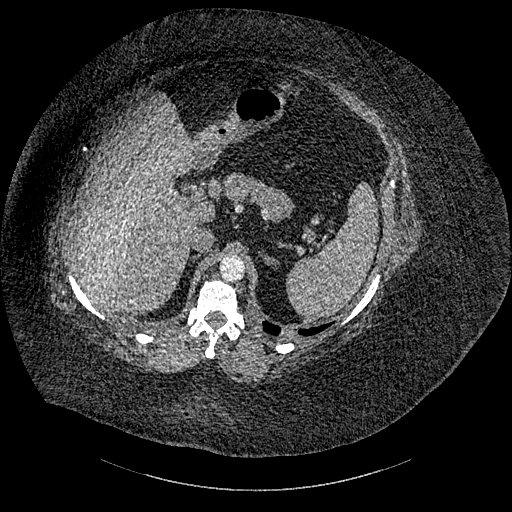
[im 75/432  lung]
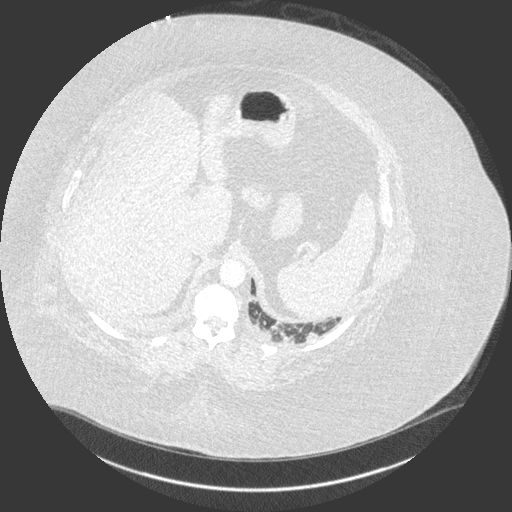
[im 113/432  soft-tissue]
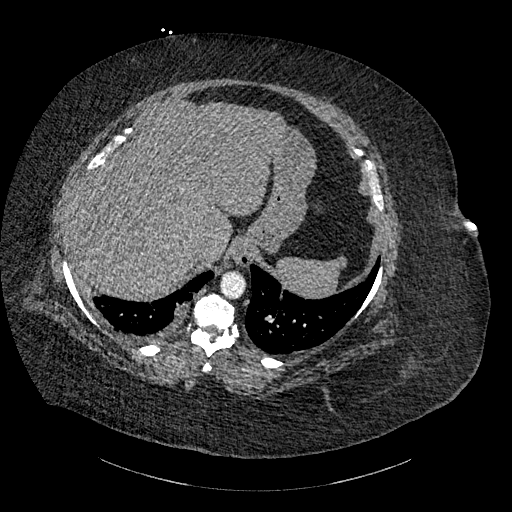
[im 150/432  lung]
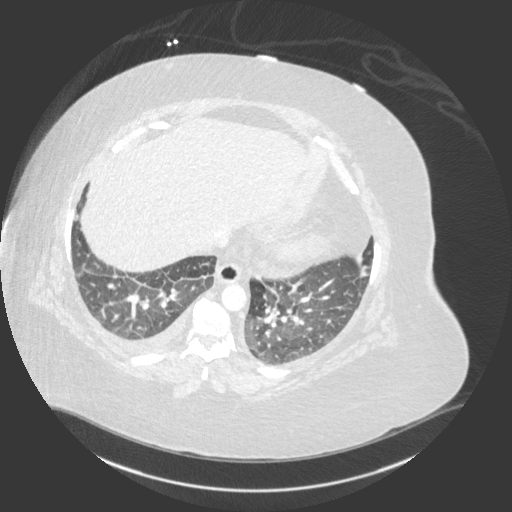
[im 169/432  soft-tissue]
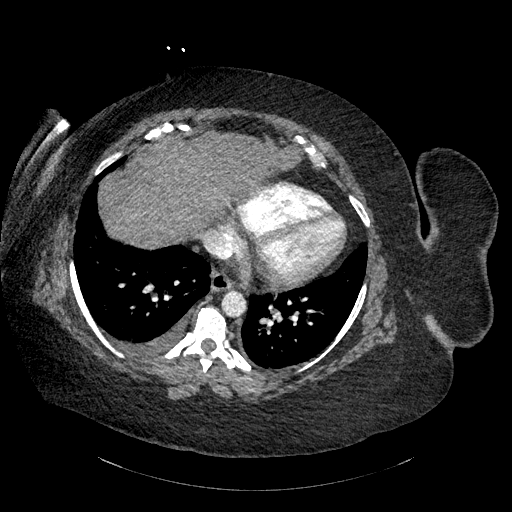
[im 207/432  lung]
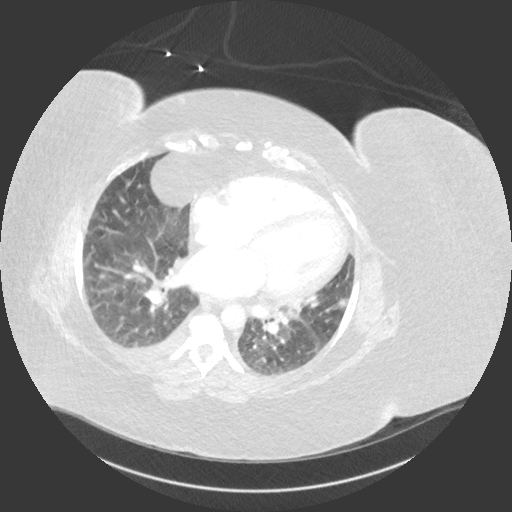
[im 225/432  soft-tissue]
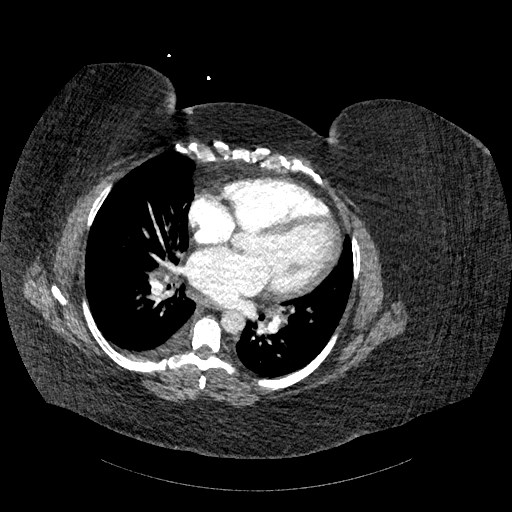
[im 263/432  lung]
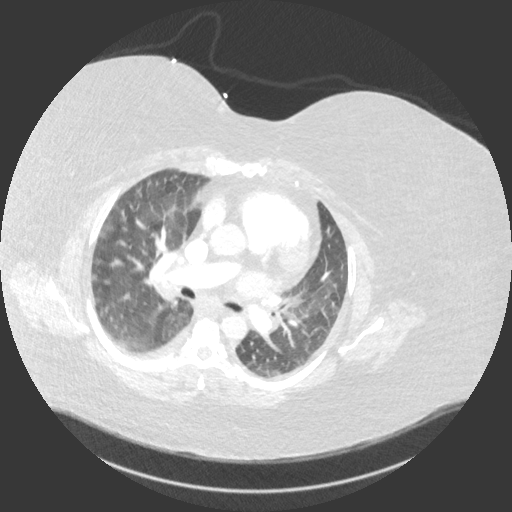
[im 282/432  soft-tissue]
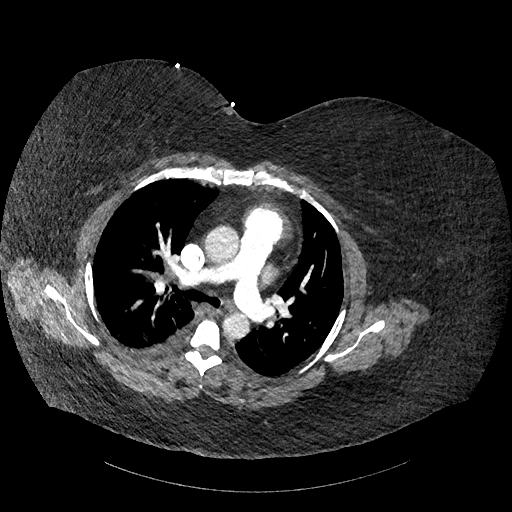
[im 319/432  lung]
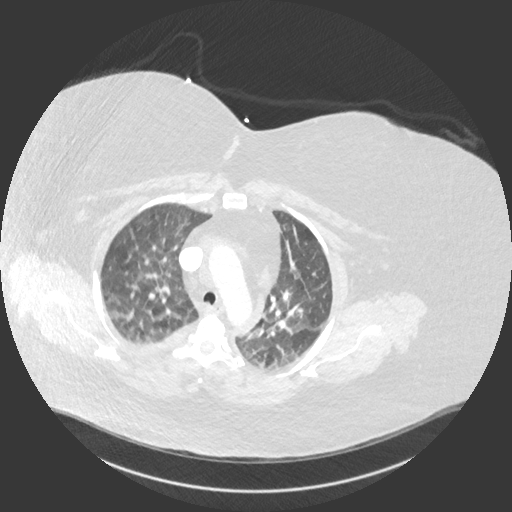
[im 357/432  soft-tissue]
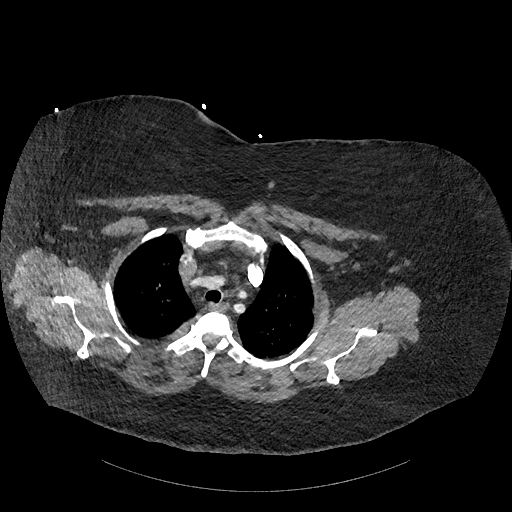
[im 375/432  lung]
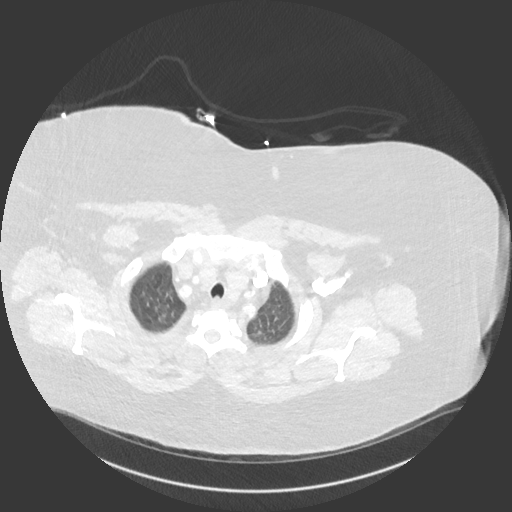
[im 413/432  soft-tissue]
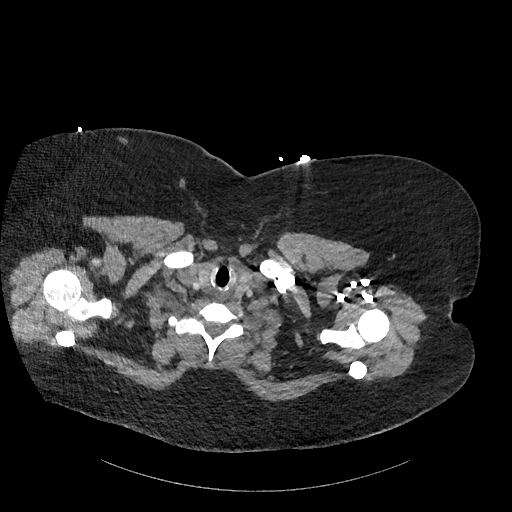

[Series 8: cor · coronal · 0.59mm/px · 3 of 206 slices shown]
[im 52/206  soft-tissue]
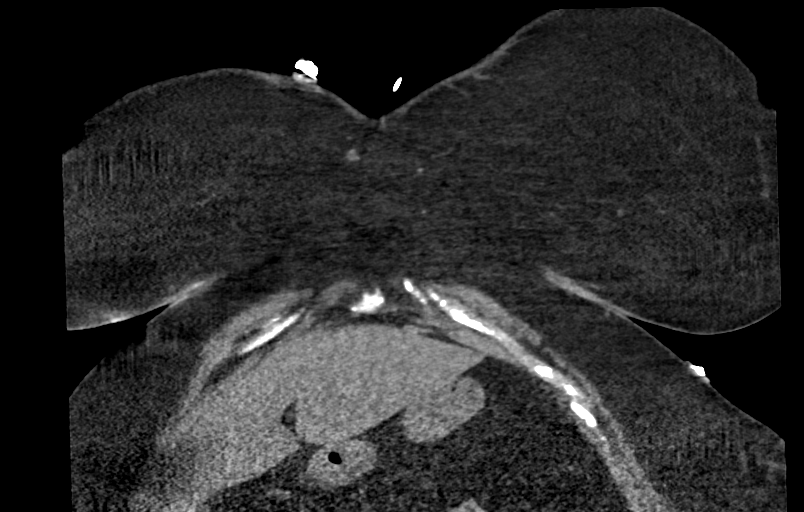
[im 103/206  soft-tissue]
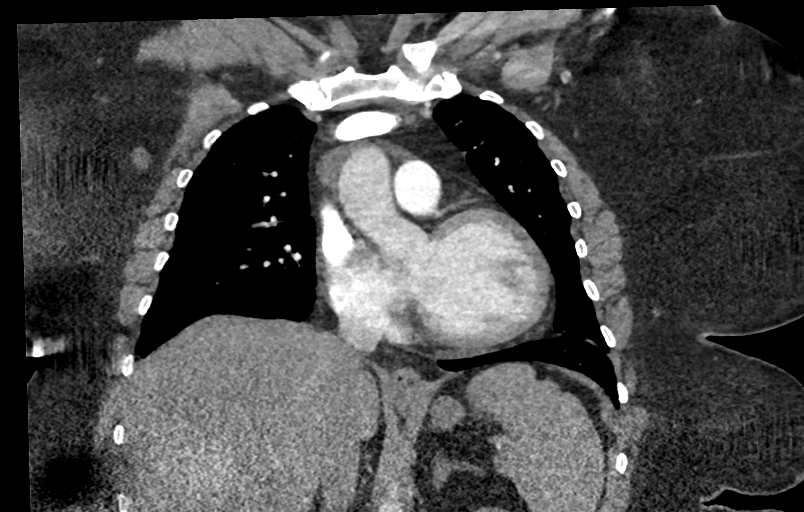
[im 154/206  soft-tissue]
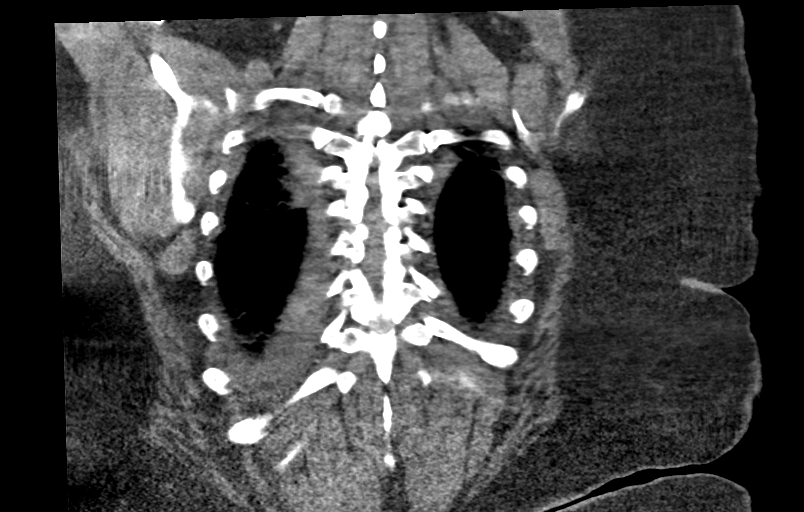

[17 of 46 positions shown; findings below may reference images not displayed]

FINDINGS: Cardiovascular: No filling defects within the pulmonary arteries to
suggest acute pulmonary embolism. No significant vascular findings.
Normal heart size. No pericardial effusion.

Mediastinum/Nodes: Enlarged RIGHT paratracheal node measures 17 mm
(image 88/5). Subcarinal node measures 14 mm. Borderline
paratracheal adenopathy. No supraclavicular adenopathy. Trachea and
esophagus appear normal.

Lungs/Pleura: No pulmonary infarction. Diffuse ground-glass
opacities in a mosaic pattern. Mild enters mild interlobular septal
thickening.

Upper Abdomen: Limited view of the liver, kidneys, pancreas are
unremarkable. Normal adrenal glands.

Musculoskeletal: No aggressive osseous lesion.

Review of the MIP images confirms the above findings.
IMPRESSION: 1. No evidence acute pulmonary embolism.
2. Interlobular septal thickening and diffuse ground-glass opacities
suggest interstitial and mild pulmonary edema pattern.
3. Mild RIGHT hilar adenopathy and mediastinal adenopathy. Favor
reactive adenopathy.

## 2023-04-11 ENCOUNTER — Other Ambulatory Visit: Payer: Commercial Managed Care - HMO

## 2023-07-18 ENCOUNTER — Encounter: Payer: Self-pay | Admitting: Cardiology

## 2023-09-02 ENCOUNTER — Other Ambulatory Visit: Payer: Self-pay | Admitting: Cardiology

## 2023-09-02 DIAGNOSIS — I480 Paroxysmal atrial fibrillation: Secondary | ICD-10-CM

## 2023-09-04 NOTE — Telephone Encounter (Signed)
Prescription refill request for Eliquis received. Indication:afib Last office visit:11/23 Scr:0.9  3/24 Age: 57 Weight:146  kg  Prescription refilled

## 2023-11-24 ENCOUNTER — Telehealth: Payer: Self-pay | Admitting: Cardiology

## 2023-11-24 NOTE — Telephone Encounter (Signed)
Patient's medication came in via mail. Patient's medication placed in red bag, with identifying information, and placed in closet. Called patient to make aware and scheduled overdue follow up for 12/14/23- patient will pick up meds then.  KBL 11/24/23

## 2023-11-30 ENCOUNTER — Other Ambulatory Visit: Payer: Self-pay | Admitting: Cardiology

## 2023-12-12 ENCOUNTER — Encounter: Payer: Self-pay | Admitting: Cardiology

## 2023-12-12 DIAGNOSIS — R202 Paresthesia of skin: Secondary | ICD-10-CM

## 2023-12-12 DIAGNOSIS — N840 Polyp of corpus uteri: Secondary | ICD-10-CM

## 2023-12-12 DIAGNOSIS — D259 Leiomyoma of uterus, unspecified: Secondary | ICD-10-CM | POA: Insufficient documentation

## 2023-12-12 DIAGNOSIS — K760 Fatty (change of) liver, not elsewhere classified: Secondary | ICD-10-CM

## 2023-12-12 DIAGNOSIS — A419 Sepsis, unspecified organism: Secondary | ICD-10-CM

## 2023-12-12 DIAGNOSIS — R110 Nausea: Secondary | ICD-10-CM

## 2023-12-12 DIAGNOSIS — K3532 Acute appendicitis with perforation and localized peritonitis, without abscess: Secondary | ICD-10-CM

## 2023-12-12 DIAGNOSIS — N938 Other specified abnormal uterine and vaginal bleeding: Secondary | ICD-10-CM | POA: Insufficient documentation

## 2023-12-12 DIAGNOSIS — R29898 Other symptoms and signs involving the musculoskeletal system: Secondary | ICD-10-CM | POA: Insufficient documentation

## 2023-12-12 DIAGNOSIS — N170 Acute kidney failure with tubular necrosis: Secondary | ICD-10-CM

## 2023-12-12 DIAGNOSIS — K047 Periapical abscess without sinus: Secondary | ICD-10-CM | POA: Insufficient documentation

## 2023-12-12 HISTORY — DX: Other specified abnormal uterine and vaginal bleeding: N93.8

## 2023-12-12 HISTORY — DX: Nausea: R11.0

## 2023-12-12 HISTORY — DX: Leiomyoma of uterus, unspecified: D25.9

## 2023-12-12 HISTORY — DX: Polyp of corpus uteri: N84.0

## 2023-12-12 HISTORY — DX: Sepsis, unspecified organism: A41.9

## 2023-12-12 HISTORY — DX: Acute appendicitis with perforation, localized peritonitis, and gangrene, without abscess: K35.32

## 2023-12-12 HISTORY — DX: Acute kidney failure with tubular necrosis: N17.0

## 2023-12-12 HISTORY — DX: Other symptoms and signs involving the musculoskeletal system: R29.898

## 2023-12-12 HISTORY — DX: Fatty (change of) liver, not elsewhere classified: K76.0

## 2023-12-12 HISTORY — DX: Periapical abscess without sinus: K04.7

## 2023-12-12 HISTORY — DX: Paresthesia of skin: R20.2

## 2023-12-13 NOTE — Progress Notes (Deleted)
Cardiology Office Note:    Date:  12/13/2023   ID:  Nancy Wu, DOB Nov 14, 1966, MRN [id number]  PCP:  Nancy Handler, NP  Cardiologist:  Nancy Herrlich, MD    Referring MD: Nancy Handler, NP    ASSESSMENT:    No diagnosis found. PLAN:    In order of problems listed above:  ***   Next appointment: ***   Medication Adjustments/Labs and Tests Ordered: Current medicines are reviewed at length with the patient today.  Concerns regarding medicines are outlined above.  No orders of the defined types were placed in this encounter.  No orders of the defined types were placed in this encounter.    History of Present Illness:    Nancy Wu is a 58 y.o. female with a hx of paroxysmal atrial fibrillation maintaining sinus rhythm with Multaq chronic anticoagulation obstructive sleep apnea diastolic heart failure and morbid obesity last seen 10/13/2022.  Compliance with diet, lifestyle and medications: ***  She was seen [facility] 11/29/2023 with complaint of irregular heart rate for 24 hours.  There is a notation that her heart rhythm returned to sinus EKG and troponins are reassuring no indication of acute syndrome she was found to have acute of decomp stated heart failure but no hypoxia or respiratory distress she was given a prescription for furosemide potassium supplement and discharged from the hospital.  EKG #1 was described as atrial fibrillation with a rate EKG #2 described as sinus rhythm 59 bpm chest x-ray showed pulmonary vascular prominence cardiomegaly and trace infiltrate laboratory studies showed a sodium 139 potassium creatinine 0.8 GFR 86 cc/min on and was undetectable proBNP level elevated 1004 platelets 266,000.  Past Medical History:  Diagnosis Date   Acute heart failure (HCC) 10/28/2021   Acute kidney injury (AKI) with acute tubular necrosis (ATN) (HCC) 12/12/2023   Acute medial meniscus tear of left knee 07/14/2019   Acute on chronic diastolic  congestive heart failure (HCC) 03/14/2022   Acute perforated appendicitis 12/12/2023   Acute pharyngitis due to other specified organisms 10/14/2020   Aftercare following surgery of the musculoskeletal system 09/27/2019   ALLERGIC RHINITIS 08/20/2007   Qualifier: Diagnosis of  By: Nancy Deutscher FNP, Nancy Wu     ALLERGIC RHINITIS 08/20/2007   Qualifier: Diagnosis of   By: Nancy Deutscher FNP, Nancy Wu      Replacing diagnoses that were inactivated after the 02/27/23 regulatory import     Anxiety    Borderline diabetes    Chest pain 05/08/2013   Normal coronary arteriography in November 2017   Chronic anticoagulation 09/26/2017   Chronic low back pain 01/25/2007   Qualifier: Diagnosis of  By: Nancy Wu     CPAP (continuous positive airway pressure) dependence 03/14/2022   Depression    Dysfunctional uterine bleeding 12/12/2023   Dyspnea 02/12/2021   Endometrial polyp 12/12/2023   Essential hypertension 01/25/2007   Qualifier: Diagnosis of  By: Nancy Wu     Gastroesophageal reflux disease without esophagitis 09/29/2016   Heart disease    afib   Hepatic steatosis 12/12/2023   High risk medication use 11/02/2016   Overview:  Multaq   HYPERTENSION, BENIGN SYSTEMIC 01/25/2007   Qualifier: Diagnosis of  By: Nancy Wu     Hypertensive heart disease without heart failure 09/29/2016   Ingrown toenail 10/21/2012   Lipid screening 07/16/2019   Migraine 07/22/2011   Migraine 07/22/2011   IMO SNOMED Dx Update Oct 2024     Morbid obesity (HCC)    Morbid obesity with  body mass index (BMI) of 50.0 to 59.9 in adult [facility]) 11/14/2017   Nausea 12/12/2023   Obesity 01/25/2007   Qualifier: Diagnosis of   By: Nancy Wu       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HCC) 11/14/2017   Obstructive sleep apnea treated with continuous positive airway pressure (CPAP) 02/12/2018   Palpitations 02/12/2021   Paroxysmal atrial fibrillation (HCC)  09/29/2016   Periapical abscess 12/12/2023   Postmenopausal bleeding 01/24/2023   PRESENCE OF IUD 11/28/2004   Qualifier: Diagnosis of  By: Nancy Deutscher FNP, Nancy Wu     Reactive depression 01/25/2007   Qualifier: Diagnosis of  By: Nancy Wu     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   Right knee meniscal tear 12/15/2011   Right leg paresthesias 12/12/2023   Right leg weakness 12/12/2023   Sciatica 01/25/2007   Qualifier: Diagnosis of  By: Nancy Wu     Sepsis (HCC) 12/12/2023   Septic shock (HCC) 12/12/2023   Sleeps in sitting position due to orthopnea 03/14/2022   Sprain of left knee 06/10/2019   Strain of left hamstring 06/01/2019   Super obesity 03/14/2022   Uterine fibroid 12/12/2023    Current Medications: Current Meds  Medication Sig   acetaminophen (TYLENOL) 500 MG tablet Take 500 mg by mouth every 6 (six) hours as needed.   methocarbamol (ROBAXIN) 500 MG tablet Take 500 mg by mouth 3 (three) times daily as needed (shoulder pain).      EKGs/Labs/Other Studies Reviewed:    The following studies were reviewed today:  Cardiac Studies & Procedures     STRESS TESTS  MYOCARDIAL PERFUSION IMAGING 09/07/2021  ECHOCARDIOGRAM  ECHOCARDIOGRAM COMPLETE 10/28/2021  Narrative ECHOCARDIOGRAM REPORT    Patient Name:   Nancy Wu Date of Exam: 10/28/2021 Medical Rec #:  [id number]        Height:       63.0 in Accession #:    [id number]       Weight:       360.0 lb Date of Birth:  [birth year]        BSA:          2.482 m Patient Age:    55 years         BP:           147/66 mmHg Patient Gender: F                HR:           66 bpm. Exam Location:  Inpatient  Procedure: 2D Echo, Cardiac Doppler and Color Doppler  Indications:    Dyspnea  History:        Patient has prior history of Echocardiogram examinations, most recent 03/02/2021. Arrythmias:Atrial Fibrillation, Signs/Symptoms:Dyspnea; Risk Factors:Sleep Apnea and Hypertension.  Sonographer:     Nancy Wu RDCS Referring Phys: [id number] Nancy Wu  IMPRESSIONS   1. Left ventricular ejection fraction, by estimation, is 55 to 60%. The left ventricle has normal function. The left ventricle has no regional wall motion abnormalities. Left ventricular diastolic parameters were normal. 2. Right ventricular systolic function is normal. The right ventricular size is normal. 3. The mitral valve is normal in structure. Mild mitral valve regurgitation. No evidence of mitral stenosis. 4. The aortic valve was not well visualized. Aortic valve regurgitation is not visualized. No aortic stenosis is present.  Comparison(s): No significant change from prior study.  FINDINGS Left Ventricle: Left ventricular ejection fraction, by estimation, is  55 to 60%. The left ventricle has normal function. The left ventricle has no regional wall motion abnormalities. The left ventricular internal cavity size was normal in size. There is no left ventricular hypertrophy. Left ventricular diastolic parameters were normal.  Right Ventricle: The right ventricular size is normal. Right ventricular systolic function is normal.  Left Atrium: Left atrial size was normal in size.  Right Atrium: Right atrial size was normal in size.  Pericardium: There is no evidence of pericardial effusion.  Mitral Valve: The mitral valve is normal in structure. Mild mitral valve regurgitation. No evidence of mitral valve stenosis.  Tricuspid Valve: The tricuspid valve is normal in structure. Tricuspid valve regurgitation is not demonstrated. No evidence of tricuspid stenosis.  Aortic Valve: The aortic valve was not well visualized. Aortic valve regurgitation is not visualized. No aortic stenosis is present.  Pulmonic Valve: The pulmonic valve was not well visualized. Pulmonic valve regurgitation is not visualized. No evidence of pulmonic stenosis.  Aorta: The aortic root is normal in size and structure.  Venous: The inferior  vena cava was not well visualized.  IAS/Shunts: No atrial level shunt detected by color flow Doppler.   LEFT VENTRICLE PLAX 2D LVIDd:         5.40 cm      Diastology LVIDs:         3.10 cm      LV e' medial:    5.68 cm/s LV PW:         1.00 cm      LV E/e' medial:  18.5 LV IVS:        1.20 cm      LV e' lateral:   9.32 cm/s LVOT diam:     2.00 cm      LV E/e' lateral: 11.3 LV SV:         91 LV SV Index:   37 LVOT Area:     3.14 cm  LV Volumes (MOD) LV vol d, MOD A2C: 127.0 ml LV vol d, MOD A4C: 131.0 ml LV vol s, MOD A2C: 66.3 ml LV vol s, MOD A4C: 62.4 ml LV SV MOD A2C:     60.7 ml LV SV MOD A4C:     131.0 ml LV SV MOD BP:      66.1 ml  RIGHT VENTRICLE RV S prime:     12.00 cm/s TAPSE (M-mode): 2.3 cm  LEFT ATRIUM             Index        RIGHT ATRIUM           Index LA diam:        4.50 cm 1.81 cm/m   RA Area:     13.40 cm LA Vol (A2C):   86.7 ml 34.93 ml/m  RA Volume:   27.70 ml  11.16 ml/m LA Vol (A4C):   76.6 ml 30.86 ml/m LA Biplane Vol: 81.6 ml 32.87 ml/m AORTIC VALVE LVOT Vmax:   128.00 cm/s LVOT Vmean:  79.400 cm/s LVOT VTI:    0.290 m  AORTA Ao Root diam: 2.70 cm Ao Asc diam:  2.70 cm  MITRAL VALVE MV Area (PHT): 3.08 cm     SHUNTS MV Decel Time: 246 msec     Systemic VTI:  0.29 m MV E velocity: 105.00 cm/s  Systemic Diam: 2.00 cm  Olga Millers MD Electronically signed by Olga Millers MD Signature Date/Time: 10/28/2021/4:58:20 PM    Final   MONITORS  LONG  TERM MONITOR (3-14 DAYS) 05/24/2022  Narrative Patch Wear Time:  7 days and 8 hours (2023-06-02T13:30:49-0400 to 2023-06-09T21:31:30-0400)  Patient had a min HR of 51 bpm, max HR of 126 bpm, and avg HR of 60 bpm. Predominant underlying rhythm was Sinus Rhythm.  1 run of PVC's occurred lasting 6 beats with a max rate of 126 bpm (avg 117 bpm).  Isolated SVEs were rare (<1.0%), SVE Couplets were rare (<1.0%), and SVE Triplets were rare (<1.0%).  There were no episodes of atrial  fibrillation or flutter.  Isolated VEs were rare (<1.0%), VE Couplets were rare (<1.0%), and no VE Triplets were present. Ventricular Bigeminy was present.  There were 8 triggered events often associated with frequent PVCs all sinus rhythm.               Recent Labs: No results found for requested labs within last 365 days.  Recent Lipid Panel    Component Value Date/Time   CHOL 155 04/22/2011 0900   TRIG 112 04/22/2011 0900   HDL 45 04/22/2011 0900   CHOLHDL 3.4 04/22/2011 0900   VLDL 22 04/22/2011 0900   LDLCALC 88 04/22/2011 0900   LDLDIRECT 80 05/08/2013 1636    Physical Exam:    VS:  There were no vitals taken for this visit.    Wt Readings from Last 3 Encounters:  10/13/22 (!) 321 lb 12.8 oz (146 kg)  09/06/22 (!) 330 lb 6.4 oz (149.9 kg)  07/13/22 (!) 364 lb (165.1 kg)     GEN: *** Well nourished, well developed in no acute distress HEENT: Normal NECK: No JVD; No carotid bruits LYMPHATICS: No lymphadenopathy CARDIAC: ***RRR, no murmurs, rubs, gallops RESPIRATORY:  Clear to auscultation without rales, wheezing or rhonchi  ABDOMEN: Soft, non-tender, non-distended MUSCULOSKELETAL:  No edema; No deformity  SKIN: Warm and dry NEUROLOGIC:  Alert and oriented x 3 PSYCHIATRIC:  Normal affect    Signed, Nancy Herrlich, MD  12/13/2023 8:32 AM    [facility]

## 2023-12-14 ENCOUNTER — Ambulatory Visit: Payer: Medicaid Other | Admitting: Cardiology

## 2024-01-29 ENCOUNTER — Telehealth: Payer: Self-pay | Admitting: Cardiology

## 2024-01-29 ENCOUNTER — Encounter: Payer: Self-pay | Admitting: Gastroenterology

## 2024-01-29 NOTE — Telephone Encounter (Signed)
 Called to reschedule patient's follow up post cancellation in January- patient says she had to switch insurance and in turn had to switch positions.Nancy Wu deleted/kbl 01/29/24

## 2024-02-12 ENCOUNTER — Encounter: Payer: Self-pay | Admitting: Nurse Practitioner

## 2024-03-29 ENCOUNTER — Ambulatory Visit: Admitting: Gastroenterology

## 2024-06-26 ENCOUNTER — Ambulatory Visit: Admitting: Gastroenterology

## 2024-08-29 ENCOUNTER — Encounter (HOSPITAL_BASED_OUTPATIENT_CLINIC_OR_DEPARTMENT_OTHER): Payer: Self-pay

## 2024-08-29 ENCOUNTER — Other Ambulatory Visit (HOSPITAL_BASED_OUTPATIENT_CLINIC_OR_DEPARTMENT_OTHER): Payer: Self-pay

## 2024-08-29 ENCOUNTER — Ambulatory Visit (HOSPITAL_BASED_OUTPATIENT_CLINIC_OR_DEPARTMENT_OTHER)
Admission: RE | Admit: 2024-08-29 | Discharge: 2024-08-29 | Disposition: A | Discharge status: Home / Self Care | Attending: Family Medicine | Admitting: Family Medicine

## 2024-08-29 VITALS — BP 125/57 | HR 61 | Temp 98.3°F | Resp 20

## 2024-08-29 DIAGNOSIS — J069 Acute upper respiratory infection, unspecified: Secondary | ICD-10-CM

## 2024-08-29 DIAGNOSIS — H9202 Otalgia, left ear: Secondary | ICD-10-CM

## 2024-08-29 DIAGNOSIS — H66002 Acute suppurative otitis media without spontaneous rupture of ear drum, left ear: Secondary | ICD-10-CM | POA: Diagnosis not present

## 2024-08-29 MED ORDER — FLUTICASONE PROPIONATE 50 MCG/ACT NA SUSP: 1.0000 | Freq: Two times a day (BID) | NASAL | 0 refills | Status: AC | PRN

## 2024-08-29 MED ORDER — CEFDINIR 300 MG PO CAPS
300.0000 mg | ORAL_CAPSULE | Freq: Two times a day (BID) | ORAL | 0 refills | Status: AC
  Filled 2024-08-29: qty 20, 10d supply, fill #0

## 2024-08-29 NOTE — ED Triage Notes (Signed)
 Onset of sinus congestion approx 2 weeks ago. States it's gotten worse and now face hurts as well as left ear pain.

## 2024-08-29 NOTE — ED Provider Notes (Signed)
 [facility]    CSN: [id number] Arrival date & time: 08/29/24  1721      History   Chief Complaint Chief Complaint  Patient presents with   Nasal Congestion    Have had congestion for couple of weeks. - Entered by patient    HPI Nancy Wu is a 58 y.o. female.   58 year old female who reports runny nose, cough, head congestion since approximately 08/13/2024 or earlier.  On 08/28/2024, she developed left ear pain and pressure.  She thinks she has gotten an ear infection.  She denies fever, cough, nausea, vomiting, constipation, diarrhea.     Past Medical History:  Diagnosis Date   Acute heart failure (HCC) 10/28/2021   Acute kidney injury (AKI) with acute tubular necrosis (ATN) 12/12/2023   Acute medial meniscus tear of left knee 07/14/2019   Acute on chronic diastolic congestive heart failure (HCC) 03/14/2022   Acute perforated appendicitis 12/12/2023   Acute pharyngitis due to other specified organisms 10/14/2020   Aftercare following surgery of the musculoskeletal system 09/27/2019   ALLERGIC RHINITIS 08/20/2007   Qualifier: Diagnosis of  By: Gladis FNP, Delorise     ALLERGIC RHINITIS 08/20/2007   Qualifier: Diagnosis of   By: Gladis FNP, Nykedtra      Replacing diagnoses that were inactivated after the 02/27/23 regulatory import     Anxiety    Borderline diabetes    Chest pain 05/08/2013   Normal coronary arteriography in November 2017   Chronic anticoagulation 09/26/2017   Chronic low back pain 01/25/2007   Qualifier: Diagnosis of  By: Geralene Service     CPAP (continuous positive airway pressure) dependence 03/14/2022   Depression    Dysfunctional uterine bleeding 12/12/2023   Dyspnea 02/12/2021   Endometrial polyp 12/12/2023   Essential hypertension 01/25/2007   Qualifier: Diagnosis of  By: Geralene Service     Gastroesophageal reflux disease without esophagitis 09/29/2016   Heart disease    afib   Hepatic steatosis 12/12/2023   High risk  medication use 11/02/2016   Overview:  [place]    HYPERTENSION, BENIGN SYSTEMIC 01/25/2007   Qualifier: Diagnosis of  By: Geralene Service     Hypertensive heart disease without heart failure 09/29/2016   Ingrown toenail 10/21/2012   Lipid screening 07/16/2019   Migraine 07/22/2011   Migraine 07/22/2011   IMO SNOMED Dx Update Oct 2024     Morbid obesity (HCC)    Morbid obesity with body mass index (BMI) of 50.0 to 59.9 in adult (HCC) 11/14/2017   Nausea 12/12/2023   Obesity 01/25/2007   Qualifier: Diagnosis of   By: Geralene Service       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HCC) 11/14/2017   Obstructive sleep apnea treated with continuous positive airway pressure (CPAP) 02/12/2018   Palpitations 02/12/2021   Paroxysmal atrial fibrillation (HCC) 09/29/2016   Periapical abscess 12/12/2023   Postmenopausal bleeding 01/24/2023   PRESENCE OF IUD 11/28/2004   Qualifier: Diagnosis of  By: Gladis FNP, Nykedtra     Reactive depression 01/25/2007   Qualifier: Diagnosis of  By: Geralene Service     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   Right knee meniscal tear 12/15/2011   Right leg paresthesias 12/12/2023   Right leg weakness 12/12/2023   Sciatica 01/25/2007   Qualifier: Diagnosis of  By: Geralene Service     Sepsis (HCC) 12/12/2023   Septic shock (HCC) 12/12/2023   Sleeps in sitting position due to orthopnea 03/14/2022  Sprain of left knee 06/10/2019   Strain of left hamstring 06/01/2019   Super obesity 03/14/2022   Uterine fibroid 12/12/2023    Patient Active Problem List   Diagnosis Date Noted   Acute kidney injury (AKI) with acute tubular necrosis (ATN) 12/12/2023   Acute perforated appendicitis 12/12/2023   Dysfunctional uterine bleeding 12/12/2023   Endometrial polyp 12/12/2023   Hepatic steatosis 12/12/2023   Nausea 12/12/2023   Periapical abscess 12/12/2023   Right leg paresthesias 12/12/2023   Right leg weakness  12/12/2023   Sepsis (HCC) 12/12/2023   Septic shock (HCC) 12/12/2023   Uterine fibroid 12/12/2023   Postmenopausal bleeding 01/24/2023   Acute on chronic diastolic congestive heart failure (HCC) 03/14/2022   CPAP (continuous positive airway pressure) dependence 03/14/2022   Sleeps in sitting position due to orthopnea 03/14/2022   Super obesity 03/14/2022   Acute heart failure (HCC) 10/28/2021   Heart disease    Dyspnea 02/12/2021   Palpitations 02/12/2021   Acute pharyngitis due to other specified organisms 10/14/2020   Aftercare following surgery of the musculoskeletal system 09/27/2019   Lipid screening 07/16/2019   Acute medial meniscus tear of left knee 07/14/2019   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   Sprain of left knee 06/10/2019   Strain of left hamstring 06/01/2019   Obstructive sleep apnea treated with continuous positive airway pressure (CPAP) 02/12/2018   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HCC) 11/14/2017   Morbid obesity with body mass index (BMI) of 50.0 to 59.9 in adult [facility]) 11/14/2017   Chronic anticoagulation 09/26/2017   Morbid obesity (HCC)    Depression    Anxiety    High risk medication use 11/02/2016   Gastroesophageal reflux disease without esophagitis 09/29/2016   Hypertensive heart disease without heart failure 09/29/2016   Paroxysmal atrial fibrillation (HCC) 09/29/2016   Chest pain 05/08/2013   Ingrown toenail 10/21/2012   Right knee meniscal tear 12/15/2011   Migraine 07/22/2011   Borderline diabetes 04/18/2011   ALLERGIC RHINITIS 08/20/2007   Obesity 01/25/2007   Reactive depression 01/25/2007   Essential hypertension 01/25/2007   Chronic low back pain 01/25/2007   SCIATICA 01/25/2007   HYPERTENSION, BENIGN SYSTEMIC 01/25/2007   PRESENCE OF IUD 11/28/2004    Past Surgical History:  Procedure Laterality Date   APPENDECTOMY     CARDIAC CATHETERIZATION Bilateral 09/29/2016   [place]    CESAREAN SECTION  1989   CHOLECYSTECTOMY  2010   KNEE SURGERY Bilateral    TUBAL LIGATION  1989    OB History     Gravida  2   Para      Term      Preterm      AB      Living  2      SAB      IAB      Ectopic      Multiple      Live Births               Home Medications    Prior to Admission medications   Medication Sig Start Date End Date Taking? Authorizing Provider  cefdinir (OMNICEF) 300 MG capsule Take 1 capsule (300 mg total) by mouth 2 (two) times daily for 10 days. 08/29/24 09/08/24 Yes Ival Domino, FNP  fluticasone  (FLONASE ) 50 MCG/ACT nasal spray Place 1 spray into both nostrils 2 (two) times daily as needed for rhinitis. 08/29/24 09/28/24 Yes Ival Domino, FNP  acetaminophen  (TYLENOL ) 500  MG tablet Take 500 mg by mouth every 6 (six) hours as needed. 04/06/23   [provider]  apixaban  (ELIQUIS ) 5 MG TABS tablet Take 1 tablet by mouth twice daily 09/04/23   Monetta Redell PARAS, MD  buPROPion (WELLBUTRIN XL) 300 MG 24 hr tablet Take 300 mg by mouth daily. 02/08/22   [provider]  dronedarone  (MULTAQ ) 400 MG tablet Take 1 tablet (400 mg total) by mouth 2 (two) times daily with a meal. 03/22/23   Munley, Redell PARAS, MD  furosemide  (LASIX ) 40 MG tablet Take 40 mg by mouth daily.    [provider]  gabapentin  (NEURONTIN ) 300 MG capsule Take 300 mg by mouth daily as needed (pain). 06/29/22   [provider]  hydrALAZINE (APRESOLINE) 25 MG tablet Take 25 mg by mouth 3 (three) times daily. 05/01/22   [provider]  losartan (COZAAR) 50 MG tablet Take 50 mg by mouth daily. 07/19/22   [provider]  methocarbamol (ROBAXIN) 500 MG tablet Take 500 mg by mouth 3 (three) times daily as needed (shoulder pain). 11/14/22   [provider]  metoprolol  succinate (TOPROL -XL) 25 MG 24 hr tablet Take 0.5 tablets (12.5 mg total) by mouth daily. Patient must keep appointment on 12-14-23 for further refills. 1 st attempt  11/30/23   Monetta Redell PARAS, MD  Multiple Vitamin (MULTIVITAMIN) capsule Take 1 capsule by mouth daily.    [provider]  pantoprazole (PROTONIX) 40 MG tablet Take 1 tablet by mouth daily. 09/28/16   [provider]  Potassium Chloride  ER 20 MEQ TBCR Take 1 tablet by mouth daily. 09/15/22   [provider]  umeclidinium-vilanterol (ANORO ELLIPTA ) 62.5-25 MCG/ACT AEPB Inhale 1 puff into the lungs daily. 07/13/22   McQuaid, Douglas B, MD  umeclidinium-vilanterol (ANORO ELLIPTA ) 62.5-25 MCG/ACT AEPB Inhale 1 puff into the lungs daily. 07/28/22   Alaine Vicenta NOVAK, MD    Family History Family History  Problem Relation Age of Onset   Depression Mother    Hypertension Mother    Stroke Father    Hypertension Father    Melanoma Father    Lymphoma Father    Heart failure Father    Depression Sister    Alcohol abuse Brother    Cancer Other    Hypertension Other    Stroke Other    Heart disease Other    Diabetes Other     Social History Social History   Tobacco Use   Smoking status: Former    Passive exposure: Past   Smokeless tobacco: Never  Vaping Use   Vaping status: Never Used  Substance Use Topics   Alcohol use: Yes    Comment: rare   Drug use: No     Allergies   Amlodipine and Codeine   Review of Systems Review of Systems  Constitutional:  Negative for chills and fever.  HENT:  Positive for congestion, ear pain, postnasal drip and rhinorrhea. Negative for sore throat.   Eyes:  Negative for pain and visual disturbance.  Respiratory:  Negative for cough and shortness of breath.   Cardiovascular:  Negative for chest pain and palpitations.  Gastrointestinal:  Negative for abdominal pain, constipation, diarrhea, nausea and vomiting.  Genitourinary:  Negative for dysuria and hematuria.  Musculoskeletal:  Negative for arthralgias and back pain.  Skin:  Negative for color change and rash.  Neurological:  Negative for seizures and syncope.  All  other systems reviewed and are negative.    Physical Exam Triage  Vital Signs ED Triage Vitals  Encounter Vitals Group     BP 08/29/24 1730 (!) 125/57     Girls Systolic BP Percentile --      Girls Diastolic BP Percentile --      Boys Systolic BP Percentile --      Boys Diastolic BP Percentile --      Pulse Rate 08/29/24 1730 61     Resp 08/29/24 1730 20     Temp 08/29/24 1730 98.3 F (36.8 C)     Temp Source 08/29/24 1730 Oral     SpO2 08/29/24 1730 97 %     Weight --      Height --      Head Circumference --      Peak Flow --      Pain Score 08/29/24 1732 5     Pain Loc --      Pain Education --      Exclude from Growth Chart --    No data found.  Updated Vital Signs BP (!) 125/57 (BP Location: Right Arm)   Pulse 61   Temp 98.3 F (36.8 C) (Oral)   Resp 20   SpO2 97%   Visual Acuity Right Eye Distance:   Left Eye Distance:   Bilateral Distance:    Right Eye Near:   Left Eye Near:    Bilateral Near:     Physical Exam Vitals and nursing note reviewed.  Constitutional:      General: She is not in acute distress.    Appearance: She is well-developed. She is not ill-appearing or toxic-appearing.  HENT:     Head: Normocephalic and atraumatic.     Right Ear: Hearing, tympanic membrane, ear canal and external ear normal.     Left Ear: Hearing, ear canal and external ear normal. A middle ear effusion is present. Tympanic membrane is erythematous.     Nose: Congestion and rhinorrhea present. Rhinorrhea is clear.     Right Sinus: No maxillary sinus tenderness or frontal sinus tenderness.     Left Sinus: No maxillary sinus tenderness or frontal sinus tenderness.     Mouth/Throat:     Lips: Pink.     Mouth: Mucous membranes are moist.     Pharynx: Uvula midline. No oropharyngeal exudate or posterior oropharyngeal erythema.     Tonsils: No tonsillar exudate.  Eyes:     Conjunctiva/sclera: Conjunctivae normal.     Pupils: Pupils are equal, round, and reactive to  light.  Cardiovascular:     Rate and Rhythm: Normal rate and regular rhythm.     Heart sounds: S1 normal and S2 normal. No murmur heard. Pulmonary:     Effort: Pulmonary effort is normal. No respiratory distress.     Breath sounds: Normal breath sounds. No decreased breath sounds, wheezing, rhonchi or rales.  Abdominal:     General: Bowel sounds are normal.     Palpations: Abdomen is soft.     Tenderness: There is no abdominal tenderness.  Musculoskeletal:        General: No swelling.     Cervical back: Neck supple.  Lymphadenopathy:     Head:     Right side of head: No submental, submandibular, tonsillar, preauricular or posterior auricular adenopathy.     Left side of head: Posterior auricular adenopathy present. No submental, submandibular, tonsillar or preauricular adenopathy.     Cervical: Cervical adenopathy present.     Right cervical: Superficial cervical adenopathy present.  Left cervical: Superficial cervical adenopathy present.  Skin:    General: Skin is warm and dry.     Capillary Refill: Capillary refill takes less than 2 seconds.     Findings: No rash.  Neurological:     Mental Status: She is alert and oriented to person, place, and time.  Psychiatric:        Mood and Affect: Mood normal.      UC Treatments / Results  Labs (all labs ordered are listed, but only abnormal results are displayed) Labs Reviewed - No data to display  EKG   Radiology No results found.  Procedures Procedures (including critical care time)  Medications Ordered in UC Medications - No data to display  Initial Impression / Assessment and Plan / UC Course  I have reviewed the triage vital signs and the nursing notes.  Pertinent labs & imaging results that were available during my care of the patient were reviewed by me and considered in my medical decision making (see chart for details).  Plan of Care: Viral upper respiratory infection with left ear pain and left ear  infection: Fluticasone  nasal spray, 1 spray into each nostril twice daily for nasal congestion.  Cefdinir, 300 mg, 1 pill twice daily for 10 days.  Get plenty of fluids and rest.  Follow-up if symptoms do not improve, worsen or new symptoms occur.  I reviewed the plan of care with the patient and/or the patient's guardian.  The patient and/or guardian had time to ask questions and acknowledged that the questions were answered.  I provided instruction on symptoms or reasons to return here or to go to an ER, if symptoms/condition did not improve, worsened or if new symptoms occurred.  Final Clinical Impressions(s) / UC Diagnoses   Final diagnoses:  Non-recurrent acute suppurative otitis media of left ear without spontaneous rupture of tympanic membrane  Left ear pain  Viral upper respiratory infection     Discharge Instructions      Viral upper respiratory infection with left ear pain and left ear infection: Fluticasone  nasal spray, 1 spray into each nostril twice daily for nasal congestion.  Cefdinir, 300 mg, 1 pill twice daily for 10 days.  Get plenty of fluids and rest.  Follow-up if symptoms do not improve, worsen or new symptoms occur.    ED Prescriptions     Medication Sig Dispense Auth. Provider   cefdinir (OMNICEF) 300 MG capsule Take 1 capsule (300 mg total) by mouth 2 (two) times daily for 10 days. 20 capsule Ival Domino, FNP   fluticasone  (FLONASE ) 50 MCG/ACT nasal spray Place 1 spray into both nostrils 2 (two) times daily as needed for rhinitis. 17 mL Ival Domino, FNP      PDMP not reviewed this encounter.   Ival Domino, FNP 08/29/24 1754

## 2024-08-29 NOTE — Discharge Instructions (Addendum)
 Viral upper respiratory infection with left ear pain and left ear infection: Fluticasone  nasal spray, 1 spray into each nostril twice daily for nasal congestion.  Cefdinir, 300 mg, 1 pill twice daily for 10 days.  Get plenty of fluids and rest.  Follow-up if symptoms do not improve, worsen or new symptoms occur.

## 2024-09-27 ENCOUNTER — Other Ambulatory Visit: Payer: Self-pay | Admitting: Medical Genetics

## 2024-10-11 ENCOUNTER — Ambulatory Visit (HOSPITAL_BASED_OUTPATIENT_CLINIC_OR_DEPARTMENT_OTHER): Admitting: Family Medicine

## 2024-10-21 ENCOUNTER — Ambulatory Visit (HOSPITAL_BASED_OUTPATIENT_CLINIC_OR_DEPARTMENT_OTHER): Admitting: Family Medicine

## 2024-10-31 ENCOUNTER — Other Ambulatory Visit

## 2024-11-01 ENCOUNTER — Other Ambulatory Visit: Payer: Self-pay

## 2024-11-01 ENCOUNTER — Ambulatory Visit: Admitting: Sports Medicine

## 2024-11-01 ENCOUNTER — Encounter: Payer: Self-pay | Admitting: Sports Medicine

## 2024-11-01 DIAGNOSIS — G8929 Other chronic pain: Secondary | ICD-10-CM

## 2024-11-01 DIAGNOSIS — M25561 Pain in right knee: Secondary | ICD-10-CM

## 2024-11-01 DIAGNOSIS — Z6841 Body Mass Index (BMI) 40.0 and over, adult: Secondary | ICD-10-CM

## 2024-11-01 DIAGNOSIS — E66813 Obesity, class 3: Secondary | ICD-10-CM | POA: Diagnosis not present

## 2024-11-01 DIAGNOSIS — M170 Bilateral primary osteoarthritis of knee: Secondary | ICD-10-CM | POA: Diagnosis not present

## 2024-11-01 DIAGNOSIS — M25562 Pain in left knee: Secondary | ICD-10-CM | POA: Diagnosis not present

## 2024-11-01 DIAGNOSIS — M6281 Muscle weakness (generalized): Secondary | ICD-10-CM

## 2024-11-01 NOTE — Progress Notes (Signed)
 Patient says that she has had knee pain for years, with the right worse than the left. She has had several knee surgeries on both knees, with the first on the right being in 2004 and the first on the left being in 2021. She says that the right knee has been keeping her up at night recently. She did have a steroid injection in the right knee 4-5 months ago, but that injection only gave her about a week of relief. She says that both knees pop often, and do feel as though they will give out on her. She uses ice and heat, which feel good when she uses them but do not give her longer lasting relief.

## 2024-11-01 NOTE — Progress Notes (Signed)
 Nancy Wu - 58 y.o. female MRN [id number]  Date of birth: Apr 11, 1966  Office Visit Note: Visit Date: 11/01/2024 PCP: Benjamine Lauraine DASEN, NP Referred by: Benjamine Lauraine DASEN, NP  Subjective: Chief Complaint  Patient presents with   Right Knee - Pain   Left Knee - Pain   HPI: Nancy Wu is a very pleasant 58 y.o. female who presents today for acute on chronic bilateral knee pain.  Pam states that she has had knee pain for years, with the right worse than the left. She has had several knee surgeries on both knees, with the first on the right being in 2004 (describes tibial tubercle osteotomy performed by Dr. Yvone) and the first on the left being in 2021 (which was meniscus/meniscectomy). She says that the right knee has been keeping her up at night recently. She did have a steroid injection in the right knee back in May by her PCP, but that injection only gave her about a week of relief. She says that both knees pop often, and do feel as though they will give out on her. She uses ice and heat, which feel good when she uses them but do not give her longer lasting relief.   Lab Results  Component Value Date   HGBA1C 5.7 08/16/2013   Hx of A-fib --> on Eliquis  (no NSAIDs).  Does take ibuprofen very infrequently although this is notably helpful when she does take it.  Pertinent ROS were reviewed with the patient and found to be negative unless otherwise specified above in HPI.   Assessment & Plan: Visit Diagnoses:  1. Bilateral primary osteoarthritis of knee   2. Chronic pain of right knee   3. Chronic pain of left knee   4. Class 3 severe obesity with body mass index (BMI) of 50.0 to 59.9 in adult, unspecified obesity type, unspecified whether serious comorbidity present (HCC)   5. Weakness of both quadriceps muscles    Plan: Impression is acute on chronic bilateral knee pain with left knee medial tibiofemoral advancing osteoarthritic change in right knee more patellofemoral based  arthralgia in the setting of likely previous TTO.  She has trialed medication, steroid injections in the past which gave her weeks to a month of relief but nothing long-lasting.  She does have evidence of quadricep insufficiency and does feel weak/unstable on the knees, given this I would like to get her started in formalized physical therapy to work on VMO and quadricep strengthening to help stabilize her knee joints and offload her arthritic change.  Recommend continuing working on healthy weight loss to reduce BMI and pressure on her knees as well.  Given her A-fib, she will continue her Eliquis  5 mg twice daily but needs to avoid NSAIDs.  Okay for over-the-counter Tylenol  or topical treatments.  We did discuss the role for injection therapy, given that she only had temporary relief from previous corticosteroid, would like to trial Zilretta, long-acting steroid injection, we will authorize her for this as well as viscosupplementation which I think will certainly help both knees given her OA in the right knee more patellofemoral arthralgia.  Once these are authorized, we will notify her and we will see her back in follow-up.  This patient is diagnosed with osteoarthritis of bilateral knees.  Radiographs show evidence of joint space narrowing, osteophytes, subchondral sclerosis and/or subchondral cysts.  This patient has knee pain which interferes with functional and activities of daily living.    This patient has experienced inadequate response, adverse  effects and/or intolerance with conservative treatments such as acetaminophen , NSAIDS, topical creams, physical therapy or regular exercise, knee bracing and/or weight loss.   This patient has experienced inadequate response or has a contraindication to intra articular steroid injections for at least 3 months.   This patient is not scheduled to have a total knee replacement within 6 months of starting treatment with  viscosupplementation. _____________________________________________________  The patient would benefit from a long-acting corticosteroid injection, I.e. Zilretta, as they have tried topical and oral anti-inflammatories such as tylenol , ibuprofen, naproxen without much relief. The have undergone nonpharmacologic therapy with lifestyle modification, home exercise or physical therapy, and attempted weight loss and exercise regimens without significant relief. They have failed long-term benefit from previous corticosteroid injection therapy (minimal or only temporary relief). For these reasons, I do think they are an appropriate candidate for Zilretta.  We will send authorization through the insurance company, will notify patient when this is approved and we will schedule in clinic for injection therapy at this time when works for the patient.   Follow-up: Return for Will return once approved for Zilretta/Gel injections.   Meds & Orders: No orders of the defined types were placed in this encounter.   Orders Placed This Encounter  Procedures   XR Knee Complete 4 Views Right   US  Extrem Low Right Ltd     Procedures: No procedures performed      Clinical History: No specialty comments available.  She reports that she has quit smoking. She has been exposed to tobacco smoke. She has never used smokeless tobacco. No results for input(s): HGBA1C, LABURIC in the last 8760 hours.  Objective:    Physical Exam  Gen: Well-appearing, in no acute distress; non-toxic CV: Well-perfused. Warm.  Resp: Breathing unlabored on room air; no wheezing. Psych: Fluid speech in conversation; appropriate affect; normal thought process  Ortho Exam - Bilateral knees: Trace effusion of the right knee, no effusion of the left knee.  Range of motion of the right knee 0-125 degrees with pain at end range of motion, left knee 0-120 degrees.  There is left knee medial joint line TTP.  There is mild degree of  varus/valgus pseudo instability of the left knee, negative on the right.  Notable patellofemoral crepitus right greater than left.  The right knee has a well-healed vertical incision from likely previous TTO procedure.  There is a degree of the amount insufficiency with quadricep weakness left > right.  Imaging: US  Extrem Low Right Ltd Result Date: 11/01/2024 Limited musculoskeletal ultrasound of the right and left knee was performed today.  Right knee with trace effusion, left knee with minimal effusion.  There is bilateral superior patellar spurring.  XR Knee Complete 4 Views Right, Left knee:  Result Date: 11/01/2024 4 view x-ray of bilateral knees was ordered and reviewed by myself today including standing AP, Rosenberg, lateral and sunrise views.  X-rays demonstrate moderate to severe medial tibiofemoral joint space narrowing with arthritic change of the left knee, mild of the right knee.  There is bilateral patellofemoral arthritis most significant in the right knee, with sclerosis over the tibial tubercle region, possibly indicative of previous TTO procedure.  Notable osteophytosis surrounding the patellofemoral joint.  No acute fracture noted.   Past Medical/Family/Surgical/Social History: Medications & Allergies reviewed per EMR, new medications updated. Patient Active Problem List   Diagnosis Date Noted   Acute kidney injury (AKI) with acute tubular necrosis (ATN) 12/12/2023   Acute perforated appendicitis 12/12/2023   Dysfunctional uterine  bleeding 12/12/2023   Endometrial polyp 12/12/2023   Hepatic steatosis 12/12/2023   Nausea 12/12/2023   Periapical abscess 12/12/2023   Right leg paresthesias 12/12/2023   Right leg weakness 12/12/2023   Sepsis (HCC) 12/12/2023   Septic shock (HCC) 12/12/2023   Uterine fibroid 12/12/2023   Postmenopausal bleeding 01/24/2023   Acute on chronic diastolic congestive heart failure (HCC) 03/14/2022   CPAP (continuous positive airway pressure)  dependence 03/14/2022   Sleeps in sitting position due to orthopnea 03/14/2022   Super obesity 03/14/2022   Acute heart failure (HCC) 10/28/2021   Heart disease    Dyspnea 02/12/2021   Palpitations 02/12/2021   Acute pharyngitis due to other specified organisms 10/14/2020   Aftercare following surgery of the musculoskeletal system 09/27/2019   Lipid screening 07/16/2019   Acute medial meniscus tear of left knee 07/14/2019   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   Sprain of left knee 06/10/2019   Strain of left hamstring 06/01/2019   Obstructive sleep apnea treated with continuous positive airway pressure (CPAP) 02/12/2018   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HCC) 11/14/2017   Morbid obesity with body mass index (BMI) of 50.0 to 59.9 in adult (HCC) 11/14/2017   Chronic anticoagulation 09/26/2017   Morbid obesity (HCC)    Depression    Anxiety    High risk medication use 11/02/2016   Gastroesophageal reflux disease without esophagitis 09/29/2016   Hypertensive heart disease without heart failure 09/29/2016   Paroxysmal atrial fibrillation (HCC) 09/29/2016   Chest pain 05/08/2013   Ingrown toenail 10/21/2012   Right knee meniscal tear 12/15/2011   Migraine 07/22/2011   Borderline diabetes 04/18/2011   ALLERGIC RHINITIS 08/20/2007   Obesity 01/25/2007   Reactive depression 01/25/2007   Essential hypertension 01/25/2007   Chronic low back pain 01/25/2007   SCIATICA 01/25/2007   HYPERTENSION, BENIGN SYSTEMIC 01/25/2007   PRESENCE OF IUD 11/28/2004   Past Medical History:  Diagnosis Date   Acute heart failure (HCC) 10/28/2021   Acute kidney injury (AKI) with acute tubular necrosis (ATN) 12/12/2023   Acute medial meniscus tear of left knee 07/14/2019   Acute on chronic diastolic congestive heart failure (HCC) 03/14/2022   Acute perforated appendicitis 12/12/2023   Acute pharyngitis due to other specified organisms 10/14/2020    Aftercare following surgery of the musculoskeletal system 09/27/2019   ALLERGIC RHINITIS 08/20/2007   Qualifier: Diagnosis of  By: Gladis FNP, Delorise     ALLERGIC RHINITIS 08/20/2007   Qualifier: Diagnosis of   By: Gladis FNP, Delorise      Replacing diagnoses that were inactivated after the 02/27/23 regulatory import     Anxiety    Borderline diabetes    Chest pain 05/08/2013   Normal coronary arteriography in November 2017   Chronic anticoagulation 09/26/2017   Chronic low back pain 01/25/2007   Qualifier: Diagnosis of  By: [facility]     CPAP (continuous positive airway pressure) dependence 03/14/2022   Depression    Dysfunctional uterine bleeding 12/12/2023   Dyspnea 02/12/2021   Endometrial polyp 12/12/2023   Essential hypertension 01/25/2007   Qualifier: Diagnosis of  By: Geralene Service     Gastroesophageal reflux disease without esophagitis 09/29/2016   Heart disease    afib   Hepatic steatosis 12/12/2023   High risk medication use 11/02/2016   Overview:  [place]    HYPERTENSION, BENIGN SYSTEMIC 01/25/2007   Qualifier: Diagnosis of  By: Geralene Service     Hypertensive heart disease  without heart failure 09/29/2016   Ingrown toenail 10/21/2012   Lipid screening 07/16/2019   Migraine 07/22/2011   Migraine 07/22/2011   IMO SNOMED Dx Update Oct 2024     Morbid obesity (HCC)    Morbid obesity with body mass index (BMI) of 50.0 to 59.9 in adult [facility]) 11/14/2017   Nausea 12/12/2023   Obesity 01/25/2007   Qualifier: Diagnosis of   By: Geralene Service       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HCC) 11/14/2017   Obstructive sleep apnea treated with continuous positive airway pressure (CPAP) 02/12/2018   Palpitations 02/12/2021   Paroxysmal atrial fibrillation (HCC) 09/29/2016   Periapical abscess 12/12/2023   Postmenopausal bleeding 01/24/2023   PRESENCE OF IUD 11/28/2004   Qualifier: Diagnosis of  By: Gladis FNP,  [place]     Reactive depression 01/25/2007   Qualifier: Diagnosis of  By: Geralene Service     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   Right knee meniscal tear 12/15/2011   Right leg paresthesias 12/12/2023   Right leg weakness 12/12/2023   Sciatica 01/25/2007   Qualifier: Diagnosis of  By: Geralene Service     Sepsis (HCC) 12/12/2023   Septic shock (HCC) 12/12/2023   Sleeps in sitting position due to orthopnea 03/14/2022   Sprain of left knee 06/10/2019   Strain of left hamstring 06/01/2019   Super obesity 03/14/2022   Uterine fibroid 12/12/2023   Family History  Problem Relation Age of Onset   Depression Mother    Hypertension Mother    Stroke Father    Hypertension Father    Melanoma Father    Lymphoma Father    Heart failure Father    Depression Sister    Alcohol abuse Brother    Cancer Other    Hypertension Other    Stroke Other    Heart disease Other    Diabetes Other    Past Surgical History:  Procedure Laterality Date   APPENDECTOMY     CARDIAC CATHETERIZATION Bilateral 09/29/2016   [place]   CESAREAN SECTION  1989   CHOLECYSTECTOMY  2010   KNEE SURGERY Bilateral    TUBAL LIGATION  1989   Social History   Occupational History   Not on file  Tobacco Use   Smoking status: Former    Passive exposure: Past   Smokeless tobacco: Never  Vaping Use   Vaping status: Never Used  Substance and Sexual Activity   Alcohol use: Yes    Comment: rare   Drug use: No   Sexual activity: Not Currently    Partners: Male    Birth control/protection: I.U.D.

## 2024-11-22 ENCOUNTER — Ambulatory Visit: Admitting: Sports Medicine

## 2024-11-26 ENCOUNTER — Telehealth: Payer: Self-pay

## 2024-11-26 NOTE — Telephone Encounter (Signed)
 Patient called and states her appt was scheduled  11/22/2024 @ 9:45 am - shows it as a No show  but she states she called to cancel appt due to her being sick. Would like this changed in the system. Once done would like a call back.   R/S her appt to 12/12/2024.  US  Zilretta Inj. Bilateral knee B/b $4.00 co-pay

## 2024-12-12 ENCOUNTER — Ambulatory Visit: Admitting: Sports Medicine

## 2024-12-25 ENCOUNTER — Ambulatory Visit

## 2024-12-25 VITALS — BP 116/86 | HR 59 | Ht 63.0 in | Wt 325.2 lb

## 2024-12-25 DIAGNOSIS — R590 Localized enlarged lymph nodes: Secondary | ICD-10-CM

## 2024-12-25 DIAGNOSIS — R911 Solitary pulmonary nodule: Secondary | ICD-10-CM | POA: Diagnosis not present

## 2024-12-25 DIAGNOSIS — I509 Heart failure, unspecified: Secondary | ICD-10-CM | POA: Diagnosis not present

## 2024-12-25 DIAGNOSIS — J9611 Chronic respiratory failure with hypoxia: Secondary | ICD-10-CM

## 2024-12-25 DIAGNOSIS — G4733 Obstructive sleep apnea (adult) (pediatric): Secondary | ICD-10-CM

## 2024-12-25 DIAGNOSIS — Z6841 Body Mass Index (BMI) 40.0 and over, adult: Secondary | ICD-10-CM

## 2024-12-25 DIAGNOSIS — Z87891 Personal history of nicotine dependence: Secondary | ICD-10-CM

## 2024-12-25 MED ORDER — UMECLIDINIUM-VILANTEROL 62.5-25 MCG/ACT IN AEPB: 1.0000 | INHALATION_SPRAY | Freq: Every day | RESPIRATORY_TRACT | 0 refills | Status: AC

## 2024-12-25 NOTE — Patient Instructions (Addendum)
" °  VISIT SUMMARY: During your visit, we discussed your recent hospitalization for pleural effusion and your ongoing management of congestive heart failure, sleep apnea, and weight loss. We reviewed your medications, oxygen use, and the need for a new CPAP machine.  YOUR PLAN: CHRONIC CONGESTIVE HEART FAILURE WITH PULMONARY EDEMA AND PLEURAL EFFUSION: You have chronic congestive heart failure with fluid buildup in your lungs, which recently led to hospitalization. -Continue taking Lasix  40 mg daily. -Monitor your weight regularly to track fluid status. -Limit your salt intake to prevent fluid retention. -you can take an extra dose of Lasix  if you notice significant weight gain or worsening symptoms. -Establish care with a heart failure clinic for ongoing management and medication adjustments- call dr Theressa office  CHRONIC RESPIRATORY FAILURE WITH HYPOXIA: You have chronic respiratory failure with low oxygen levels, requiring supplemental oxygen. -Continue using supplemental oxygen  -Monitor your oxygen levels during activities and at night. -We will provide additional documentation if your insurance requires proof of oxygen necessity with CPAP use.  OBSTRUCTIVE SLEEP APNEA TREATED WITH CONTINUOUS POSITIVE AIRWAY PRESSURE (CPAP): You have obstructive sleep apnea managed with a CPAP machine, which needs replacement. -We have ordered a new CPAP machine using your previous sleep study data. -Ensure your new CPAP machine is compatible with oxygen use at night. -We will schedule a follow-up in 2-3 months to assess the effectiveness of your CPAP and oxygen use.  MORBID OBESITY, BMI 50.0-59.9: You have morbid obesity and are working on weight loss with the medication Zepbound -Continue your weight loss efforts  -Monitor your weight regularly to track your progress.    Contains text generated by [employer].   "

## 2024-12-25 NOTE — Assessment & Plan Note (Signed)
 Reviewed prior sleep study from 2023 Reports compliance with cpap machine Will prescribe a new machine through [facility] Encouraged compliance Orders:   Ambulatory Referral for DME

## 2024-12-25 NOTE — Progress Notes (Unsigned)
 "  New Patient Pulmonology Office Visit   Subjective:  Patient ID: Nancy Wu, female    DOB: 08-03-1966  MRN: [id number]  Referred by: Benjamine Lauraine DASEN, NP  CC:  Chief Complaint  Patient presents with   Follow-up    F/U from hospital visit. O2 drops when showing, uses CPAP at night 2 liters and 1 liter prn during the daytime. Experiences SOB upon exertion. A little bit of a cough, especially when O2 levels are low, dry cough not producing sputum.     HPI Nancy Wu is a 59 y.o. female with ***  Discussed the use of AI scribe software for clinical note transcription with the patient, who gave verbal consent to proceed.  History of Present Illness Nancy Wu is a 59 year old female with congestive heart failure and sleep apnea who presents with shortness of breath and recent hospitalization for pleural effusion.  She was recently hospitalized at [facility] on December 11, 2024, due to worsening shortness of breath. During this hospitalization, a half-liter of fluid was drained from her right lung. She was previously hospitalized in December 2025 for pneumonia, where a CT scan showed fluid inside the lung. Her symptoms have included severe shortness of breath, to the point where she 'couldn't even walk across the little hall without just feeling like I was dying from not breathing.'  She has a history of congestive heart failure diagnosed at the end of 2022 and is under the care of a cardiologist. She takes 40 mg of Lasix  daily along with potassium supplements. Her dose of Lasix  was not increased during her recent hospitalization. She monitors her weight at home to track fluid retention, noting a recent gain of three to four pounds over two days prior to her ER visit.  She has a history of paroxysmal atrial fibrillation and has been referred to an electrophysiologist for potential ablation. She has been on flecainide  in the past.  She also has sleep apnea and uses a  CPAP machine with settings of 6 to 13 cm H2O. She cannot sleep without it and is due for a new machine as her current one has exceeded its lifespan. Since her recent hospitalization, she has been using supplemental oxygen, initially requiring it during activity but now primarily at night.  She has a significant smoking history, having smoked two packs per day for twenty years before quitting in 2008. She has not had a recent pulmonary function test but previously saw Dr. McQuaid for this. She currently uses albuterol as needed.  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 She is actively trying to lose weight and has started on Downcept, a weight loss medication. She experiences shortness of breath with activity, especially in cold weather, and her oxygen levels drop during exertion and at night.     {PULM QUESTIONNAIRES (Optional):33196}  ROS  Allergies: Amlodipine and Codeine Current Medications[1] Past Medical History:  Diagnosis Date   Acute heart failure (HCC) 10/28/2021   Acute kidney injury (AKI) with acute tubular necrosis (ATN) 12/12/2023   Acute medial meniscus tear of left knee 07/14/2019   Acute on chronic diastolic congestive heart failure (HCC) 03/14/2022   Acute perforated appendicitis 12/12/2023   Acute pharyngitis due to other specified organisms 10/14/2020   Aftercare following surgery of the musculoskeletal system 09/27/2019   ALLERGIC RHINITIS 08/20/2007   Qualifier: Diagnosis of  By: Gladis FNP, Delorise     ALLERGIC RHINITIS 08/20/2007  Qualifier: Diagnosis of   By: Gladis FNP, Nykedtra      Replacing diagnoses that were inactivated after the 02/27/23 regulatory import     Anxiety    Borderline diabetes    Chest pain 05/08/2013   Normal coronary arteriography in November 2017   Chronic anticoagulation 09/26/2017   Chronic low back pain 01/25/2007   Qualifier: Diagnosis of  By: Geralene Service     CPAP  (continuous positive airway pressure) dependence 03/14/2022   Depression    Dysfunctional uterine bleeding 12/12/2023   Dyspnea 02/12/2021   Endometrial polyp 12/12/2023   Essential hypertension 01/25/2007   Qualifier: Diagnosis of  By: Geralene Service     Gastroesophageal reflux disease without esophagitis 09/29/2016   Heart disease    afib   Hepatic steatosis 12/12/2023   High risk medication use 11/02/2016   Overview:  [place]    HYPERTENSION, BENIGN SYSTEMIC 01/25/2007   Qualifier: Diagnosis of  By: Geralene Service     Hypertensive heart disease without heart failure 09/29/2016   Ingrown toenail 10/21/2012   Lipid screening 07/16/2019   Migraine 07/22/2011   Migraine 07/22/2011   IMO SNOMED Dx Update Oct 2024     Morbid obesity (HCC)    Morbid obesity with body mass index (BMI) of 50.0 to 59.9 in adult (HCC) 11/14/2017   Nausea 12/12/2023   Obesity 01/25/2007   Qualifier: Diagnosis of   By: Geralene Service       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HCC) 11/14/2017   Obstructive sleep apnea treated with continuous positive airway pressure (CPAP) 02/12/2018   Palpitations 02/12/2021   Paroxysmal atrial fibrillation (HCC) 09/29/2016   Periapical abscess 12/12/2023   Postmenopausal bleeding 01/24/2023   PRESENCE OF IUD 11/28/2004   Qualifier: Diagnosis of  By: Gladis FNP, Nykedtra     Reactive depression 01/25/2007   Qualifier: Diagnosis of  By: Geralene Service     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   Right knee meniscal tear 12/15/2011   Right leg paresthesias 12/12/2023   Right leg weakness 12/12/2023   Sciatica 01/25/2007   Qualifier: Diagnosis of  By: Geralene Service     Sepsis (HCC) 12/12/2023   Septic shock (HCC) 12/12/2023   Sleeps in sitting position due to orthopnea 03/14/2022   Sprain of left knee 06/10/2019   Strain of left hamstring 06/01/2019   Super obesity 03/14/2022   Uterine fibroid 12/12/2023    Past Surgical History:  Procedure Laterality Date   APPENDECTOMY     CARDIAC CATHETERIZATION Bilateral 09/29/2016   [place]   CESAREAN SECTION  1989   CHOLECYSTECTOMY  2010   KNEE SURGERY Bilateral    TUBAL LIGATION  1989   Family History  Problem Relation Age of Onset   Depression Mother    Hypertension Mother    Stroke Father    Hypertension Father    Melanoma Father    Lymphoma Father    Heart failure Father    Depression Sister    Alcohol abuse Brother    Cancer Other    Hypertension Other    Stroke Other    Heart disease Other    Diabetes Other    Social History   Socioeconomic History   Marital status: Married    Spouse name: Not on file   Number of children: Not on file   Years of education: Not on file   Highest education level: Not on file  Occupational History  Not on file  Tobacco Use   Smoking status: Former    Passive exposure: Past   Smokeless tobacco: Never  Vaping Use   Vaping status: Never Used  Substance and Sexual Activity   Alcohol use: Yes    Comment: rare   Drug use: No   Sexual activity: Not Currently    Partners: Male    Birth control/protection: I.U.D.  Other Topics Concern   Not on file  Social History Narrative   Right handed   Caffeine use: 2 cups coffee per day, 2 sodas per day.   Social Drivers of Health   Tobacco Use: Medium Risk (12/25/2024)   Patient History    Smoking Tobacco Use: Former    Smokeless Tobacco Use: Never    Passive Exposure: Past  Physicist, Medical Strain: Not on file  Food Insecurity: Low Risk (03/11/2024)   Received from [facility]   Epic    Within the past 12 months, you worried that your food would run out before you got money to buy more: Never true    Within the past 12 months, the food you bought just didn't last and you didn't have money to get more. : Never true  Transportation Needs: Not on file  Physical Activity: Not on file  Stress: Not on file  Social Connections: Not on  file  Intimate Partner Violence: Unknown (03/04/2022)   Received from [facility]   HITS    Physically Hurt: Not on file    Insult or Talk Down To: Not on file    Threaten Physical Harm: Not on file    Scream or Curse: Not on file  Depression (EYV7-0): Not on file  Alcohol Screen: Not on file  Housing: Low Risk (03/11/2024)   Received from [facility]   Epic    What is your living situation today?: I have a steady place to live    Think about the place you live. Do you have problems with any of the following? Choose all that apply:: Not on file  Utilities: Not on file  Health Literacy: Not on file         Objective:  BP 116/86   Pulse (!) 59   Ht 5' 3 (1.6 m) Comment: per pt  Wt (!) 325 lb 4 oz (147.5 kg)   SpO2 97% Comment: on RA  BMI 57.62 kg/m  {Pulm Vitals (Optional):32837}  Physical Exam Constitutional:      General: She is not in acute distress.    Appearance: Normal appearance.  HENT:     Mouth/Throat:     Mouth: Mucous membranes are moist.  Cardiovascular:     Rate and Rhythm: Normal rate.  Pulmonary:     Effort: No respiratory distress.     Breath sounds: No wheezing or rales.  Musculoskeletal:     Right lower leg: No edema.     Left lower leg: No edema.  Skin:    General: Skin is warm.  Neurological:     Mental Status: She is alert and oriented to person, place, and time.  Psychiatric:        Mood and Affect: Mood normal.     Diagnostic Review:  {Labs (Optional):32838}  Pft     No data to display             ECHO 10/2021 1. Left ventricular ejection fraction, by estimation, is 55 to 60%. The  left ventricle has normal function. The left ventricle has no regional  wall motion abnormalities. Left ventricular diastolic parameters were  normal.   2. Right ventricular systolic function is normal. The right ventricular  size is normal.   3. The mitral valve is normal in structure. Mild mitral valve  regurgitation. No evidence of mitral  stenosis.   4. The aortic valve was not well visualized. Aortic valve regurgitation  is not visualized. No aortic stenosis is present.   December 2022 CT angiogram chest images howing some mosaicism bilaterally, cardiomegaly, nonspecific groundglass findings in left upper lobe, scant small radiology felt the pattern was consistent with pulmonary edema, trace mediastinal adenopathy on the right  July 2023 CT angiogram chest report: No evidence of PE, large groundglass opacity in the anterior aspect of the right lower lobe as well as multiple nodular opacities in the right upper lobe concerning for pneumonia. October 2023 CT chest images independently reviewed showing clearing of the right lower lobe pneumonia, scattered bilateral lower lobe pulmonary nodules, largest is 6 mm  08/2022 spirometry ratio 85%, FEV1 2.02 L 78% predicted  Results Radiology Chest CT (12/11/2024): Right-sided pleural effusion with small left-sided effusion; pulmonary edema; no discrete pulmonary nodule or mass; ground-glass opacities attributed to fluid; diffuse haziness; no evidence of fibrotic lung disease; interval progression compared to prior study. (Independently interpreted) Chest CT (10/2024): Pulmonary edema; no significant pleural effusion; no discrete pulmonary nodule or mass; diffuse haziness; no evidence of fibrotic lung disease. (Independently interpreted) Thoracic ultrasound (11/2024): Right-sided pleural effusion. (Independently interpreted)  Diagnostic Polysomnography (2023): Obstructive sleep apnea; CPAP settings 6 to 13 cm H2O recommended.      Assessment & Plan:   Assessment & Plan Obstructive sleep apnea treated with continuous positive airway pressure (CPAP)  Orders:   Ambulatory Referral for DME  Chronic congestive heart failure, unspecified heart failure type (HCC)  Orders:   DG Chest 2 View; Future  Chronic respiratory failure with hypoxia (HCC)  Orders:   Pulmonary function test;  Future  BMI 50.0-59.9, [facility])     Former smoker  Orders:   Pulmonary function test; Future    Thank you for the opportunity to take part in the care of Carlethia Mesquita   Return in about 3 months (around 03/25/2025).   Fradel Baldonado Pleas, MD [facility] Office: [phone]     [1]  Current Outpatient Medications:    acetaminophen  (TYLENOL ) 500 MG tablet, Take 500 mg by mouth every 6 (six) hours as needed., Disp: , Rfl:    apixaban  (ELIQUIS ) 5 MG TABS tablet, Take 1 tablet by mouth twice daily, Disp: 180 tablet, Rfl: 1   buPROPion (WELLBUTRIN XL) 300 MG 24 hr tablet, Take 300 mg by mouth daily., Disp: , Rfl:    dronedarone  (MULTAQ ) 400 MG tablet, Take 1 tablet (400 mg total) by mouth 2 (two) times daily with a meal., Disp: 180 tablet, Rfl: 1   fluticasone  (FLONASE ) 50 MCG/ACT nasal spray, Place 1 spray into both nostrils 2 (two) times daily as needed for rhinitis., Disp: 17 mL, Rfl: 0   furosemide  (LASIX ) 40 MG tablet, Take 40 mg by mouth daily., Disp: , Rfl:    gabapentin  (NEURONTIN ) 300 MG capsule, Take 300 mg by mouth daily as needed (pain)., Disp: , Rfl:    hydrALAZINE (APRESOLINE) 25 MG tablet, Take 25 mg by mouth 3 (three) times daily., Disp: , Rfl:    losartan (COZAAR) 50 MG tablet, Take 50 mg by mouth daily., Disp: , Rfl:    metoprolol  succinate (TOPROL -XL) 25 MG 24 hr  tablet, Take 0.5 tablets (12.5 mg total) by mouth daily. Patient must keep appointment on 12-14-23 for further refills. 1 st attempt, Disp: 15 tablet, Rfl: 0   Multiple Vitamin (MULTIVITAMIN) capsule, Take 1 capsule by mouth daily., Disp: , Rfl:    pantoprazole (PROTONIX) 40 MG tablet, Take 1 tablet by mouth daily., Disp: , Rfl:    Potassium Chloride  ER 20 MEQ TBCR, Take 1 tablet by mouth daily., Disp: , Rfl:    methocarbamol (ROBAXIN) 500 MG tablet, Take 500 mg by mouth 3 (three) times daily as needed (shoulder pain)., Disp: , Rfl:    umeclidinium-vilanterol (ANORO ELLIPTA ) 62.5-25  MCG/ACT AEPB, Inhale 1 puff into the lungs daily. (Patient not taking: Reported on 12/25/2024), Disp: 60 each, Rfl: 11   umeclidinium-vilanterol (ANORO ELLIPTA ) 62.5-25 MCG/ACT AEPB, Inhale 1 puff into the lungs daily., Disp: 2 each, Rfl: 0  "

## 2024-12-31 ENCOUNTER — Ambulatory Visit: Payer: Self-pay

## 2025-01-01 ENCOUNTER — Encounter

## 2025-03-26 ENCOUNTER — Encounter

## 2025-03-26 ENCOUNTER — Ambulatory Visit
